# Patient Record
Sex: Female | Born: 2000 | Race: White | Hispanic: No | Smoking: Former smoker
Health system: Southern US, Community
[De-identification: ages and names within clinical notes are randomized; demographics above are authoritative.]

## PROBLEM LIST (undated history)

## (undated) DIAGNOSIS — F329 Major depressive disorder, single episode, unspecified: Secondary | ICD-10-CM

## (undated) DIAGNOSIS — K219 Gastro-esophageal reflux disease without esophagitis: Secondary | ICD-10-CM

## (undated) DIAGNOSIS — G43909 Migraine, unspecified, not intractable, without status migrainosus: Secondary | ICD-10-CM

## (undated) DIAGNOSIS — M792 Neuralgia and neuritis, unspecified: Secondary | ICD-10-CM

## (undated) DIAGNOSIS — F909 Attention-deficit hyperactivity disorder, unspecified type: Secondary | ICD-10-CM

## (undated) DIAGNOSIS — I10 Essential (primary) hypertension: Secondary | ICD-10-CM

## (undated) DIAGNOSIS — M419 Scoliosis, unspecified: Secondary | ICD-10-CM

## (undated) DIAGNOSIS — R110 Nausea: Secondary | ICD-10-CM

## (undated) DIAGNOSIS — F32A Depression, unspecified: Secondary | ICD-10-CM

## (undated) DIAGNOSIS — J302 Other seasonal allergic rhinitis: Secondary | ICD-10-CM

## (undated) HISTORY — DX: Migraine, unspecified, not intractable, without status migrainosus: G43.909

## (undated) HISTORY — DX: Neuralgia and neuritis, unspecified: M79.2

## (undated) HISTORY — DX: Essential (primary) hypertension: I10

## (undated) HISTORY — DX: Gastro-esophageal reflux disease without esophagitis: K21.9

## (undated) HISTORY — PX: BACK SURGERY: SHX140

## (undated) HISTORY — DX: Other seasonal allergic rhinitis: J30.2

## (undated) HISTORY — DX: Depression, unspecified: F32.A

## (undated) HISTORY — DX: Attention-deficit hyperactivity disorder, unspecified type: F90.9

## (undated) HISTORY — DX: Nausea: R11.0

## (undated) HISTORY — PX: MULTIPLE TOOTH EXTRACTIONS: SHX2053

## (undated) HISTORY — DX: Scoliosis, unspecified: M41.9

---
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
## 2019-01-04 ENCOUNTER — Other Ambulatory Visit (INDEPENDENT_AMBULATORY_CARE_PROVIDER_SITE_OTHER): Payer: Self-pay | Admitting: Neurology

## 2019-01-07 ENCOUNTER — Other Ambulatory Visit (INDEPENDENT_AMBULATORY_CARE_PROVIDER_SITE_OTHER): Payer: Self-pay | Admitting: Neurology

## 2019-02-06 ENCOUNTER — Ambulatory Visit (INDEPENDENT_AMBULATORY_CARE_PROVIDER_SITE_OTHER): Payer: Medicaid Other | Admitting: Neurology

## 2019-02-06 ENCOUNTER — Other Ambulatory Visit: Payer: Self-pay

## 2019-02-06 ENCOUNTER — Encounter (INDEPENDENT_AMBULATORY_CARE_PROVIDER_SITE_OTHER): Payer: Self-pay | Admitting: Neurology

## 2019-02-06 DIAGNOSIS — G43009 Migraine without aura, not intractable, without status migrainosus: Secondary | ICD-10-CM | POA: Diagnosis not present

## 2019-02-06 DIAGNOSIS — G44219 Episodic tension-type headache, not intractable: Secondary | ICD-10-CM | POA: Diagnosis not present

## 2019-02-06 DIAGNOSIS — G479 Sleep disorder, unspecified: Secondary | ICD-10-CM

## 2019-02-06 DIAGNOSIS — M792 Neuralgia and neuritis, unspecified: Secondary | ICD-10-CM | POA: Diagnosis not present

## 2019-02-06 MED ORDER — PROPRANOLOL HCL 20 MG PO TABS: 40.0000 mg | ORAL_TABLET | Freq: Two times a day (BID) | ORAL | 4 refills | Status: DC

## 2019-02-06 MED ORDER — CYCLOBENZAPRINE HCL 10 MG PO TABS: ORAL_TABLET | ORAL | 4 refills | Status: DC

## 2019-02-06 MED ORDER — GABAPENTIN 300 MG PO CAPS: ORAL_CAPSULE | ORAL | 5 refills | Status: DC

## 2019-02-06 NOTE — Patient Instructions (Signed)
Since everything has been the same over the past few months, continue the same dose of medications for now If there is any new symptoms, call the office otherwise I would like to see you in 5 months for follow-up visit.

## 2019-02-06 NOTE — Progress Notes (Signed)
This is a Pediatric Specialist E-Visit follow up consult provided via WebEx Luxe Hortencia Pilaricole Barefield consented to an E-Visit consult today.  Location of patient: Leslie PiperLayla is at home Location of provider: Keturah Shaverseza Torien Ramroop, MD is in office Patient was referred by Ronnette JuniperPringle, Joseph, MD   The following participants were involved in this E-Visit:  Lenard SimmerKelly Clark, CMA Dr Tiburcio BashNabizadeh Haide, patient  Chief Complain/ Reason for E-Visit today: headaches Total time on call: 25 minutes Follow up: 5 months  Patient: Rennis ChrisLayla Nicole Orr MRN: [id number] Sex: female DOB: 06/21/2001  Provider: Keturah Shaverseza Earnestine Shipp, MD [facility]  Note type: Routine return visit  Referral Source: Ronnette JuniperJoseph Pringle, MD History [facility]: [facility] Chief Complaint: Headaches  History of Present Illness: Leslie Orr is a 18 y.o. female is on WebEx for follow-up evaluation of headache and neuropathic pain.  Patient has been seen over the past few years with episodes of headache, sleep difficulty and significant neuropathic pain and several other issues, currently on multiple different medications. She was last seen in January 2020 and since then she has been fairly stable without having any new symptoms although she is still having similar complaints as before with headache, sleep difficulty and neuropathic pain but none of her complaints changed or got worse over the past few months. Currently she has been taking fairly high dose of propranolol for headache and gabapentin for neuropathic pain as well as moderate dose of muscle relaxant in addition to medication for anxiety and depressed mood and ADHD. Over the past few months she is still having frequent headaches but they are not worse than before and she is still having some difficulty sleeping through the night.  She has been walking around without any difficulty and her neuropathic pain is stable as well.  Review of Systems: 12 system review  as per HPI, otherwise negative.  History reviewed. No pertinent past medical history. Hospitalizations: No., Head Injury: No., Nervous System Infections: No., Immunizations up to date: Yes.    Surgical History Past Surgical History:  Procedure Laterality Date  . BACK SURGERY  04/22/14 - 04/27/14   Scoliosis  . OTHER SURGICAL HISTORY  Nov. 2001   Pilori Stenosis Surgery    Family History family history includes ADD / ADHD in her brother; Anxiety disorder in her mother; COPD in her paternal grandmother; Depression in her maternal grandmother and mother; Drug abuse in her mother; Emphysema in her paternal grandmother; Heart Problems in her maternal grandfather and mother; Other in her father; Seizures in her father.  Social History Social History   Socioeconomic History  . Marital status: Single    Spouse name: Not on file  . Number of children: Not on file  . Years of education: Not on file  . Highest education level: Not on file  Occupational History  . Not on file  Social Needs  . Financial resource strain: Not on file  . Food insecurity:    Worry: Not on file    Inability: Not on file  . Transportation needs:    Medical: Not on file    Non-medical: Not on file  Tobacco Use  . Smoking status: Passive Smoke Exposure - Never Smoker  . Smokeless tobacco: Never Used  Substance and Sexual Activity  . Alcohol use: No  . Drug use: No  . Sexual activity: Never    Birth control/protection: Abstinence  Lifestyle  . Physical activity:    Days per week: Not on file  Minutes per session: Not on file  . Stress: Not on file  Relationships  . Social connections:    Talks on phone: Not on file    Gets together: Not on file    Attends religious service: Not on file    Active member of club or organization: Not on file    Attends meetings of clubs or organizations: Not on file    Relationship status: Not on file  Other Topics Concern  . Not on file  Social History Narrative    She is home schooled and she has to get her GED.  She lives with her grandparents and two brothers. She enjoys Netflix, reading, and friends visiting.      The medication list was reviewed and reconciled. All changes or newly prescribed medications were explained.  A complete medication list was provided to the patient/caregiver.  Allergies  Allergen Reactions  . Other     Seasonal Allergies  Dust- Takes allergy shots weekly  . Lactase Nausea Only    abd pain    Physical Exam There were no vitals taken for this visit. Her limited neurological exam on WebEx is unremarkable.  She was awake, alert although slightly sleepy, follows instructions appropriately with fluent speech.  She had normal cranial nerve exam.  She did not have any tremor with normal coordination and balance.  Assessment and Plan 1. Migraine without aura and without status migrainosus, not intractable   2. Episodic tension-type headache, not intractable   3. Sleeping difficulty   4. Neuropathic pain   5. Neuritis    This is an 18 year old female with multiple medical and psychological issues as mentioned in HPI, currently on multiple medications with stable condition without any worsening of any of her symptoms over the past few months.  She has no new findings on her neurological examination. Recommend to continue the same dose of medication for now which will help her with her headache, neuropathic pain and muscle spasms and also help with sleeping through the night. She will continue with regular exercise and activity. She will continue with appropriate hydration and adequate sleep through the night. She may take occasional OTC medications for moderate to severe headache. If there is any new symptoms she will call my office otherwise I would like to see her in 5 months for follow-up visit.  She understood and agreed with the plan.  Meds ordered this encounter  Medications  . propranolol (INDERAL) 20 MG tablet     Sig: Take 2 tablets (40 mg total) by mouth 2 (two) times daily.    Dispense:  120 tablet    Refill:  4  . cyclobenzaprine (FLEXERIL) 10 MG tablet    Sig: TAKE 1 TABLET BY MOUTH EVERYDAY AT BEDTIME    Dispense:  30 tablet    Refill:  4  . gabapentin (NEURONTIN) 300 MG capsule    Sig: 2 capsules 3 times daily p.o.    Dispense:  180 capsule    Refill:  5

## 2019-03-04 ENCOUNTER — Other Ambulatory Visit (INDEPENDENT_AMBULATORY_CARE_PROVIDER_SITE_OTHER): Payer: Self-pay | Admitting: Neurology

## 2019-04-30 ENCOUNTER — Ambulatory Visit (LOCAL_COMMUNITY_HEALTH_CENTER): Payer: Medicaid Other

## 2019-04-30 ENCOUNTER — Other Ambulatory Visit: Payer: Self-pay

## 2019-04-30 VITALS — BP 132/85 | Ht 63.0 in | Wt 172.0 lb

## 2019-04-30 DIAGNOSIS — Z30013 Encounter for initial prescription of injectable contraceptive: Secondary | ICD-10-CM

## 2019-04-30 DIAGNOSIS — Z3042 Encounter for surveillance of injectable contraceptive: Secondary | ICD-10-CM

## 2019-04-30 DIAGNOSIS — Z3009 Encounter for other general counseling and advice on contraception: Secondary | ICD-10-CM | POA: Diagnosis not present

## 2019-04-30 MED ORDER — MEDROXYPROGESTERONE ACETATE 150 MG/ML IM SUSP: 150.0000 mg | INTRAMUSCULAR | 0 refills | Status: DC

## 2019-04-30 NOTE — Progress Notes (Signed)
Depo given per 10/3118 C. Lazarus Salines PA order; tolerated well Aileen Fass, RN

## 2019-05-07 ENCOUNTER — Other Ambulatory Visit (INDEPENDENT_AMBULATORY_CARE_PROVIDER_SITE_OTHER): Payer: Self-pay | Admitting: Pediatrics

## 2019-07-18 ENCOUNTER — Ambulatory Visit: Payer: Medicaid Other

## 2019-07-24 ENCOUNTER — Ambulatory Visit: Payer: Medicaid Other

## 2019-08-01 ENCOUNTER — Other Ambulatory Visit: Payer: Self-pay

## 2019-08-01 ENCOUNTER — Ambulatory Visit (LOCAL_COMMUNITY_HEALTH_CENTER): Payer: Medicaid Other

## 2019-08-01 VITALS — BP 124/81 | Ht 63.0 in | Wt 177.0 lb

## 2019-08-01 DIAGNOSIS — Z3009 Encounter for other general counseling and advice on contraception: Secondary | ICD-10-CM | POA: Diagnosis not present

## 2019-08-01 DIAGNOSIS — Z30013 Encounter for initial prescription of injectable contraceptive: Secondary | ICD-10-CM | POA: Diagnosis not present

## 2019-08-01 MED ORDER — MEDROXYPROGESTERONE ACETATE 150 MG/ML IM SUSP
150.0000 mg | Freq: Once | INTRAMUSCULAR | Status: AC
  Administered 2019-08-01: 150 mg via INTRAMUSCULAR

## 2019-08-01 NOTE — Progress Notes (Signed)
Pt here for Depo today. Pt reports no issues with Depo and is satisfied with Depo as BCM. Last Depo 04/30/2019 so she is 13 weeks and 2 days post last Depo. Pt received Depo today per Antoine Primas, PA order from 11/17/2018 visit in [place] records Depo was ordered for 1 year. Pt tolerated Depo well.Ronny Bacon, RN

## 2019-08-07 ENCOUNTER — Other Ambulatory Visit (INDEPENDENT_AMBULATORY_CARE_PROVIDER_SITE_OTHER): Payer: Self-pay | Admitting: Pediatrics

## 2019-10-25 ENCOUNTER — Other Ambulatory Visit (INDEPENDENT_AMBULATORY_CARE_PROVIDER_SITE_OTHER): Payer: Self-pay | Admitting: Neurology

## 2019-10-31 ENCOUNTER — Ambulatory Visit (LOCAL_COMMUNITY_HEALTH_CENTER): Payer: Medicaid Other | Admitting: Family Medicine

## 2019-10-31 ENCOUNTER — Ambulatory Visit (LOCAL_COMMUNITY_HEALTH_CENTER): Payer: Medicaid Other

## 2019-10-31 ENCOUNTER — Other Ambulatory Visit: Payer: Self-pay

## 2019-10-31 ENCOUNTER — Encounter: Payer: Self-pay | Admitting: Family Medicine

## 2019-10-31 VITALS — BP 133/93 | HR 118 | Ht 63.0 in | Wt 178.4 lb

## 2019-10-31 DIAGNOSIS — Z30013 Encounter for initial prescription of injectable contraceptive: Secondary | ICD-10-CM

## 2019-10-31 DIAGNOSIS — Z23 Encounter for immunization: Secondary | ICD-10-CM

## 2019-10-31 DIAGNOSIS — Z3009 Encounter for other general counseling and advice on contraception: Secondary | ICD-10-CM

## 2019-10-31 DIAGNOSIS — Z01419 Encounter for gynecological examination (general) (routine) without abnormal findings: Secondary | ICD-10-CM

## 2019-10-31 DIAGNOSIS — Z113 Encounter for screening for infections with a predominantly sexual mode of transmission: Secondary | ICD-10-CM

## 2019-10-31 LAB — WET PREP FOR TRICH, YEAST, CLUE
Clue Cell Exam: NEGATIVE
Trichomonas Exam: NEGATIVE
Yeast Exam: NEGATIVE

## 2019-10-31 MED ORDER — MULTI-VITAMIN/MINERALS PO TABS: 1.0000 | ORAL_TABLET | Freq: Every day | ORAL | 0 refills | Status: AC

## 2019-10-31 MED ORDER — MEDROXYPROGESTERONE ACETATE 150 MG/ML IM SUSP
150.0000 mg | INTRAMUSCULAR | Status: AC
  Administered 2019-10-31 – 2020-07-07 (×4): 150 mg via INTRAMUSCULAR

## 2019-10-31 NOTE — Progress Notes (Signed)
Last Inderal taken 2 days ago. Per client, has appt with [facility] tomorrow and states Inderal will be reordered at that time. Jossie Ng, RN

## 2019-10-31 NOTE — Progress Notes (Signed)
Family Planning Visit- Initial Visit  Subjective:  Leslie Orr is a 19 y.o. being seen today for an initial well woman visit and to discuss family planning options.  She is currently using Depo-Provera injections for pregnancy prevention. Patient reports she does not want a pregnancy in the next year.  Patient has the following medical conditions has Migraine without aura and without status migrainosus, not intractable; Tension headache; Idiopathic scoliosis; Episodic tension-type headache, not intractable; Neuropathic pain; Bruxism; Neuritis; Sleeping difficulty; Depression; Gastroesophageal reflux disease; and ADHD on their problem list.  Chief Complaint  Patient presents with  . Annual Physical and Depo    Patient reports she is here for depo and physical. She is 13 wks post last depo injection. Has been on depo x2 yrs.   HTN: ran out of pills a few days ago, usually on BB, therefore states pulse and BP are elevated today. States she is feeling well and has upcoming PCP appt for more.    Body mass index is 31.6 kg/m. - Patient is eligible for diabetes screening based on BMI and age >12?  no HA1C ordered? not applicable 1Patient reports 1 of partners in last year. Desires STI screening?  Yes  Does the patient have a current or past history of drug use? No   No components found for: HCV]   Health Maintenance Due  Topic Date Due  . CHLAMYDIA SCREENING  07/26/2015  . TETANUS/TDAP  07/26/2019    ROS + for:  Nausea: attributes to reflux HA: seeing PCP for this  The following portions of the patient's history were reviewed and updated as appropriate: allergies, current medications, past family history, past medical history, past social history, past surgical history and problem list. Problem list updated.   See flowsheet for other program required questions.  Objective:   Vitals:   10/31/19 1521  BP: (!) 133/93  Pulse: (!) 118  Weight: 178 lb 6.4 oz (80.9 kg)   Height: 5\' 3"  (1.6 m)   Repeat pulse: 102  Physical Exam Vitals and nursing note reviewed.  Constitutional:      Appearance: Normal appearance.  HENT:     Head: Normocephalic and atraumatic.     Mouth/Throat:     Mouth: Mucous membranes are moist.     Pharynx: Oropharynx is clear. No oropharyngeal exudate or posterior oropharyngeal erythema.  Eyes:     Conjunctiva/sclera: Conjunctivae normal.  Neck:     Thyroid: No thyroid mass, thyromegaly or thyroid tenderness.  Cardiovascular:     Rate and Rhythm: Accelerated rate, regular rhythm. No murmurs.    Pulses: Normal pulses.     Heart sounds: Normal heart sounds.  Pulmonary:     Effort: Pulmonary effort is normal.     Breath sounds: Normal breath sounds.  Chest:     Breasts: Deferred until age 18. Abdominal:     General: Abdomen is flat.     Palpations: There is no mass.     Tenderness: There is no abdominal tenderness. There is no rebound.  Genitourinary:     General: Normal vulva.     Exam position: Lithotomy position.     Pubic Area: No rash or pubic lice.      Labia:        Right: No rash or lesion.        Left: No rash or lesion.      Vagina: Normal. No vaginal discharge, erythema, bleeding or lesions.     Cervix: No cervical motion  tenderness, discharge, friability, lesion or erythema.     Uterus: Normal.      Adnexa: Right adnexa normal and left adnexa normal.     Rectum: Normal.  Lymphadenopathy:     Head:     Right side of head: No preauricular or posterior auricular adenopathy.     Left side of head: No preauricular or posterior auricular adenopathy.     Cervical: No cervical adenopathy.     Upper Body:     Right upper body: No supraclavicular or axillary adenopathy.     Left upper body: No supraclavicular or axillary adenopathy.     Lower Body: No right inguinal adenopathy. No left inguinal adenopathy.  Skin:    General: Skin is warm and dry.     Findings: No rash.  Neurological:     Mental Status: She  is alert and oriented to person, place, and time.       Assessment and Plan:  Leslie Orr is a 19 y.o. female presenting to the [facility] Department for an initial well woman exam/family planning visit  Contraception counseling: Reviewed all forms of birth control options in the tiered based approach. available including abstinence; over the counter/barrier methods; hormonal contraceptive medication including pill, patch, ring, injection,contraceptive implant; hormonal and nonhormonal IUDs; permanent sterilization options including vasectomy and the various tubal sterilization modalities. Risks, benefits, and typical effectiveness rates were reviewed.  Questions were answered.  Written information was also given to the patient to review.  Patient desires depo, this was prescribed for patient. She will follow up in  3 months for surveillance.  She was told to call with any further questions, or with any concerns about this method of contraception.  Emphasized use of condoms 100% of the time for STI prevention.  ECP n/a, continuous depo use.    1. Well woman exam -Physical today.  -Rx depo x1 yr. Counseling as above. Risks of Depo use >2 yrs discussed with pt. Alternative BCM discussed, pt would like to continue Depo. Suggested calcium (1200 mg daily) and vitamin D (800 IU daily) supplements, weight bearing exercise and avoiding cigarette smoking and excessive alcohol consumption to promote bone health.  -Follow up in 3 months for repeat Depo Provera injection.  -Pt states she has soon f/u with PCP for chronic issues, including HTN for which she needs more medication - medroxyPROGESTERone (DEPO-PROVERA) injection 150 mg  2. Screening examination for venereal disease -Screenings today as below. Treat wet prep per standing order. -Patient does not meet criteria for HepB, HepC Screening.  -Counseled on warning s/sx and when to seek care. Recommended condom use with all sex and  discussed importance of condom use for STI prevention. - HIV [place] [facility] - Syphilis Serology, [facility] - Chlamydia/Gonorrhea [facility] - WET PREP FOR TRICH, YEAST, CLUE   Return in about 11 weeks (around 01/16/2020) for Depo.  Future Appointments  Date Time Provider Department Center  11/01/2019  3:30 PM Keturah Shavers, MD [facility] None    Ann Held, PA-C

## 2019-11-01 ENCOUNTER — Encounter (INDEPENDENT_AMBULATORY_CARE_PROVIDER_SITE_OTHER): Payer: Self-pay | Admitting: Neurology

## 2019-11-01 ENCOUNTER — Ambulatory Visit (INDEPENDENT_AMBULATORY_CARE_PROVIDER_SITE_OTHER): Payer: Medicaid Other | Admitting: Neurology

## 2019-11-01 ENCOUNTER — Encounter: Payer: Self-pay | Admitting: Family Medicine

## 2019-11-01 VITALS — Ht 64.0 in | Wt 178.0 lb

## 2019-11-01 DIAGNOSIS — G479 Sleep disorder, unspecified: Secondary | ICD-10-CM | POA: Diagnosis not present

## 2019-11-01 DIAGNOSIS — G44219 Episodic tension-type headache, not intractable: Secondary | ICD-10-CM

## 2019-11-01 DIAGNOSIS — M792 Neuralgia and neuritis, unspecified: Secondary | ICD-10-CM

## 2019-11-01 DIAGNOSIS — G43009 Migraine without aura, not intractable, without status migrainosus: Secondary | ICD-10-CM | POA: Diagnosis not present

## 2019-11-01 MED ORDER — GABAPENTIN 300 MG PO CAPS: ORAL_CAPSULE | ORAL | 6 refills | Status: DC

## 2019-11-01 MED ORDER — PROMETHAZINE HCL 25 MG PO TABS: ORAL_TABLET | ORAL | 2 refills | Status: DC

## 2019-11-01 MED ORDER — CYCLOBENZAPRINE HCL 10 MG PO TABS: ORAL_TABLET | ORAL | 6 refills | Status: DC

## 2019-11-01 MED ORDER — PROPRANOLOL HCL 20 MG PO TABS: 40.0000 mg | ORAL_TABLET | Freq: Two times a day (BID) | ORAL | 6 refills | Status: DC

## 2019-11-01 NOTE — Patient Instructions (Addendum)
Continue with the same dose of propranolol at 40 mg twice daily Continue with lower dose of Neurontin at 600 mg twice daily May take Flexeril and promethazine if needed for muscle spasms or for nausea Continue drinking more water with adequate sleep and limiting screen time Call my office if there is any more frequent headache or body pain Return in 7 months for follow-up visit

## 2019-11-01 NOTE — Progress Notes (Signed)
This is a Pediatric Specialist E-Visit follow up consult provided via WebEx [name] consented to an E-Visit consult today.  Location of patient: [place] is at Home(location) Location of provider: Keturah Shavers, MD is at Office (location) Patient was referred by Ronnette Juniper, MD   The following participants were involved in this E-Visit: Tresa Endo, [place]              Keturah Shavers, MD Chief Complain/ Reason for E-Visit today: Headache Total time on call: 25 minutes Follow up: 7 months   Patient: Leslie Orr MRN: [id number] Sex: female DOB: Sep 18, 2001  Provider: Keturah Shavers, MD Location of Care: [facility]  Note type: Routine return visit History from: patient and [facility] chart Chief Complaint: Headaches are the same, rib pain the same  History of Present Illness: Leslie Orr is a 19 y.o. female is here on WebEx for follow-up management of headache and muscle pain and neuropathy.  She has been having multiple medical issues for which she has been seen for the past several years including frequent headache, neuropathic pain, musculoskeletal pain, sleep difficulty, anxiety and depression.  She has been seen and followed by psychiatry and has been on multiple medications that prescribed by neurology and psychiatry. She was last seen in April and since then she has been doing fairly well and has been having less frequent headaches and has not been taking OTC medications frequently.  She is also having less neuropathy and neuropathic pain and has been taking lower dose of Neurontin.  She has been on fairly high dose of propranolol but she has been tolerating the medication well and it has been working for her headaches significantly. She has been sleeping better through the night as well and has not had any new complaints at this time although she is still having musculoskeletal and neuropathic pain off and on but they are not worse then before.  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  Review of Systems: 12 system review as per HPI, otherwise negative.  Past Medical History:  Diagnosis Date  . Acid reflux   . ADHD   . Depression   . Hypertension   . Migraines   . Nausea    due to reflux  . Neuralgia    intercostal nerve pain at ribs  . Seasonal allergies    Hospitalizations: No., Head Injury: No., Nervous System Infections: No., Immunizations up to date: Yes.     Surgical History Past Surgical History:  Procedure Laterality Date  . BACK SURGERY  04/22/14 - 04/27/14   Scoliosis  . OTHER SURGICAL HISTORY  Nov. 2001   [facility] Stenosis Surgery    Family History family history includes ADD / ADHD in her brother; Anxiety disorder in her mother; Arthritis in her maternal grandmother; COPD in her paternal grandmother; Depression in her maternal grandmother and mother; Diabetes in her paternal grandmother; Drug abuse in her mother; Emphysema in her paternal grandmother; Heart Problems in her maternal grandfather and mother; Hyperlipidemia in her maternal grandmother; Migraines in her father and maternal grandmother; Other in her father; Seizures in her father.   Social History Social History   Socioeconomic History  . Marital status: Single    Spouse name: Not on file  . Number of children: Not on file  . Years of education: Not on file  . Highest education level: Not  on file  Occupational History  . Not on file  Tobacco Use  . Smoking status: Passive Smoke Exposure - Never Smoker  . Smokeless tobacco: Never Used  Substance and Sexual Activity  . Alcohol use: Never  . Drug use: Never  . Sexual activity: Yes    Birth control/protection: Abstinence, Condom, Injection  Other Topics Concern  . Not on file  Social History Narrative   She is home schooled and she has to get her GED.  She lives with her grandparents and two brothers. She  enjoys Netflix, reading, and friends visiting.    Social Determinants of Health   Financial Resource Strain:   . Difficulty of Paying Living Expenses: Not on file  Food Insecurity:   . Worried About Programme researcher, broadcasting/film/video in the Last Year: Not on file  . Ran Out of Food in the Last Year: Not on file  Transportation Needs:   . Lack of Transportation (Medical): Not on file  . Lack of Transportation (Non-Medical): Not on file  Physical Activity:   . Days of Exercise per Week: Not on file  . Minutes of Exercise per Session: Not on file  Stress:   . Feeling of Stress : Not on file  Social Connections:   . Frequency of Communication with Friends and Family: Not on file  . Frequency of Social Gatherings with Friends and Family: Not on file  . Attends Religious Services: Not on file  . Active Member of Clubs or Organizations: Not on file  . Attends Banker Meetings: Not on file  . Marital Status: Not on file     The medication list was reviewed and reconciled. All changes or newly prescribed medications were explained.  A complete medication list was provided to the patient/caregiver.  Allergies  Allergen Reactions  . Other     Seasonal Allergies - itchy eyes, runny nose Dust- Takes allergy shots weekly (10/31/2019 - no longer taking)  . Lactase Nausea Only    abd pain  . Penicillins Rash    Physical Exam Ht 5\' 4"  (1.626 m) Comment: patient reported  Wt 178 lb (80.7 kg) Comment: patient reported  BMI 30.55 kg/m  Her limited neurological exam on WebEx is normal.  She was awake, alert, follows instructions appropriately with normal comprehension and fluent speech.  She had normal and symmetric face with no nystagmus and with normal cranial nerve exam.  She had normal walk with no coordination or balance issues.  She did not have any tremor and no dysmetria on finger-to-nose testing.  She had normal range of motion.  Assessment and Plan 1. Migraine without aura and  without status migrainosus, not intractable   2. Episodic tension-type headache, not intractable   3. Neuropathic pain   4. Sleeping difficulty   5. Neuritis    This is an 19 year old female with multiple medical issues as mentioned in HPI who has been seen over the past few years with fairly good improvement over the past few months, needed less medication including Neurontin although she takes many other medications as she was in the past but no new medications over the past year.  She has a fairly normal limited neurological exam.  She also look happier and not appear in pain as she was in the past. Recommend to continue the same dose of medication as she is taking at this time which include fairly high dose of propranolol at 40 mg twice daily and lower dose of gabapentin that  she is taking now which is 600 mg twice daily. She is also taking occasional Flexeril for muscle spasms and promethazine for nausea with her headache.  She may take occasional Tylenol or ibuprofen for moderate to severe headache.  Occasionally she may use Imitrex. Discussed with patient the importance of drinking more water and adequate sleep and limited screen time to prevent from more headaches. She will continue follow-up with psychiatry to manage her other medications. I would like to see her in 7 months for follow-up visit or sooner if she develops more frequent symptoms.  She understood and agreed with the plan.   Meds ordered this encounter  Medications  . propranolol (INDERAL) 20 MG tablet    Sig: Take 2 tablets (40 mg total) by mouth 2 (two) times daily.    Dispense:  120 tablet    Refill:  6  . gabapentin (NEURONTIN) 300 MG capsule    Sig: 2 capsules 2 times daily p.o.    Dispense:  120 capsule    Refill:  6  . cyclobenzaprine (FLEXERIL) 10 MG tablet    Sig: TAKE 1 TABLET BY MOUTH EVERYDAY AT BEDTIME    Dispense:  30 tablet    Refill:  6  . promethazine (PHENERGAN) 25 MG tablet    Sig: Take 1 tablet as  needed for nausea or vomiting    Dispense:  30 tablet    Refill:  2

## 2020-01-31 ENCOUNTER — Ambulatory Visit (LOCAL_COMMUNITY_HEALTH_CENTER): Payer: Medicaid Other

## 2020-01-31 ENCOUNTER — Other Ambulatory Visit: Payer: Self-pay

## 2020-01-31 VITALS — BP 100/68 | Ht 63.0 in | Wt 177.0 lb

## 2020-01-31 DIAGNOSIS — Z30013 Encounter for initial prescription of injectable contraceptive: Secondary | ICD-10-CM

## 2020-01-31 DIAGNOSIS — Z3009 Encounter for other general counseling and advice on contraception: Secondary | ICD-10-CM

## 2020-01-31 NOTE — Progress Notes (Signed)
Client no longer desires to take MVI. Depo administered without difficulty per 10/31/2019 written order of J. Staples PA-C and client tolerated without complaint. Jossie Ng, RN

## 2020-03-03 ENCOUNTER — Encounter: Payer: Self-pay | Admitting: Emergency Medicine

## 2020-03-03 ENCOUNTER — Other Ambulatory Visit: Payer: Self-pay

## 2020-03-03 ENCOUNTER — Emergency Department
Admission: EM | Admit: 2020-03-03 | Discharge: 2020-03-03 | Disposition: A | Discharge status: Home / Self Care | Payer: Medicaid Other | Attending: Emergency Medicine | Admitting: Emergency Medicine

## 2020-03-03 DIAGNOSIS — Z7722 Contact with and (suspected) exposure to environmental tobacco smoke (acute) (chronic): Secondary | ICD-10-CM | POA: Insufficient documentation

## 2020-03-03 DIAGNOSIS — Z79899 Other long term (current) drug therapy: Secondary | ICD-10-CM | POA: Insufficient documentation

## 2020-03-03 DIAGNOSIS — R112 Nausea with vomiting, unspecified: Secondary | ICD-10-CM | POA: Insufficient documentation

## 2020-03-03 DIAGNOSIS — R197 Diarrhea, unspecified: Secondary | ICD-10-CM | POA: Diagnosis not present

## 2020-03-03 DIAGNOSIS — I10 Essential (primary) hypertension: Secondary | ICD-10-CM | POA: Insufficient documentation

## 2020-03-03 LAB — URINALYSIS, COMPLETE (UACMP) WITH MICROSCOPIC
Bilirubin Urine: NEGATIVE
Glucose, UA: NEGATIVE mg/dL
Hgb urine dipstick: NEGATIVE
Ketones, ur: NEGATIVE mg/dL
Nitrite: NEGATIVE
Protein, ur: NEGATIVE mg/dL
Specific Gravity, Urine: 1.01 (ref 1.005–1.030)
pH: 6 (ref 5.0–8.0)

## 2020-03-03 LAB — COMPREHENSIVE METABOLIC PANEL
ALT: 16 U/L (ref 0–44)
AST: 18 U/L (ref 15–41)
Albumin: 4.5 g/dL (ref 3.5–5.0)
Alkaline Phosphatase: 82 U/L (ref 38–126)
Anion gap: 7 (ref 5–15)
BUN: 7 mg/dL (ref 6–20)
CO2: 24 mmol/L (ref 22–32)
Calcium: 9.6 mg/dL (ref 8.9–10.3)
Chloride: 106 mmol/L (ref 98–111)
Creatinine, Ser: 0.57 mg/dL (ref 0.44–1.00)
GFR calc Af Amer: >60 mL/min (ref >60)
GFR calc non Af Amer: >60 mL/min (ref >60)
Glucose, Bld: 96 mg/dL (ref 70–99)
Potassium: 4.1 mmol/L (ref 3.5–5.1)
Sodium: 137 mmol/L (ref 135–145)
Total Bilirubin: 0.7 mg/dL (ref 0.3–1.2)
Total Protein: 7.8 g/dL (ref 6.5–8.1)

## 2020-03-03 LAB — CBC
HCT: 39.2 % (ref 36.0–46.0)
Hemoglobin: 13.1 g/dL (ref 12.0–15.0)
MCH: 29.5 pg (ref 26.0–34.0)
MCHC: 33.4 g/dL (ref 30.0–36.0)
MCV: 88.3 fL (ref 80.0–100.0)
Platelets: 329 10*3/uL (ref 150–400)
RBC: 4.44 MIL/uL (ref 3.87–5.11)
RDW: 12.8 % (ref 11.5–15.5)
WBC: 4.4 10*3/uL (ref 4.0–10.5)
nRBC: 0.5 % — ABNORMAL HIGH (ref 0.0–0.2)

## 2020-03-03 LAB — LIPASE, BLOOD: Lipase: 20 U/L (ref 11–51)

## 2020-03-03 LAB — POCT PREGNANCY, URINE: Preg Test, Ur: NEGATIVE

## 2020-03-03 MED ORDER — LOPERAMIDE HCL 2 MG PO TABS
2.0000 mg | ORAL_TABLET | ORAL | 0 refills | Status: AC
Start: 2020-03-03 — End: ?

## 2020-03-03 MED ORDER — SODIUM CHLORIDE 0.9% FLUSH: 3.0000 mL | Freq: Once | INTRAVENOUS | Status: DC

## 2020-03-03 MED ORDER — PANTOPRAZOLE SODIUM 20 MG PO TBEC
20.0000 mg | DELAYED_RELEASE_TABLET | Freq: Every day | ORAL | 1 refills | Status: DC
Start: 2020-03-03 — End: 2022-01-12

## 2020-03-03 NOTE — ED Triage Notes (Addendum)
C/O diarrhea x 2 months.  Prescribed bentyl, without results.  Also c/o migraine and nausea x 1 day.  AAOx3.  Skin warm and dry. NAD

## 2020-03-03 NOTE — ED Provider Notes (Signed)
[facility] Emergency Department Provider Note  Time seen: 3:30 PM  I have reviewed the triage vital signs and the nursing notes.   HISTORY  Chief Complaint Diarrhea and Headache   HPI Leslie Orr is a 19 y.o. female with a past medical history of gastric reflux, hypertension, possible IBS, presents to the emergency department for nausea vomiting diarrhea.  According to the patient for the past 2 months she has been experiencing intermittent nausea vomiting and diarrhea.  States the diarrhea is on a near daily basis the vomiting only occurs once or twice per week.  Patient states a history of H. pylori in the past, has a GI doctor at [facility] that she follows up with but has not been able to see during this episode.  Has a follow-up appointment scheduled.  Patient denies any fever at any point.  Has not taken any antidiarrheal medication.  Patient states her doctor diagnosed her with likely IBS, tried Bentyl but did not notice any improvement.  Patient went to the walk-in clinic this morning was experiencing some mild lower abdominal pain/cramping so sent to the emergency department.  Here denies any discomfort at this time.  Past Medical History:  Diagnosis Date  . Acid reflux   . ADHD   . Depression   . Hypertension   . Migraines   . Nausea    due to reflux  . Neuralgia    intercostal nerve pain at ribs  . Seasonal allergies     Patient Active Problem List   Diagnosis Date Noted  . Sleeping difficulty 01/28/2017  . Bruxism 02/11/2016  . Neuritis 02/11/2016  . Neuropathic pain 02/27/2015  . Depression 02/14/2015  . Gastroesophageal reflux disease 02/14/2015  . Episodic tension-type headache, not intractable 07/19/2014  . Migraine without aura and without status migrainosus, not intractable 02/21/2014  . Tension headache 02/21/2014  . Idiopathic scoliosis 02/21/2014  . ADHD 01/11/2014    Past Surgical History:  Procedure Laterality Date  . BACK  SURGERY  04/22/14 - 04/27/14   Scoliosis  . OTHER SURGICAL HISTORY  Nov. 2001   Pilori Stenosis Surgery    Prior to Admission medications   Medication Sig Start Date End Date Taking? Authorizing Provider  azelastine (ASTELIN) 0.1 % nasal spray ONE SPRAY EACH NOSTRIL EVERY 12 HOUR 09/18/15   [provider]  baclofen (LIORESAL) 10 MG tablet Taking 10mg  three times per day 06/30/16   [provider]  buPROPion (WELLBUTRIN XL) 150 MG 24 hr tablet Take 150 mg by mouth.    [provider]  cetirizine (ZYRTEC) 10 MG tablet Take 10 mg by mouth daily.    [provider]  cyclobenzaprine (FLEXERIL) 10 MG tablet TAKE 1 TABLET BY MOUTH EVERYDAY AT BEDTIME 11/01/19   11/03/19, MD  DULoxetine (CYMBALTA) 30 MG capsule Take 90 mg by mouth daily.    [provider]  DULoxetine (CYMBALTA) 60 MG capsule 1 CAPSULE BY MOUTH DAILY WITH 30MG  DULOXETINE 03/20/18   [provider]  EPIPEN 2-PAK 0.3 MG/0.3ML SOAJ injection See admin instructions. 06/17/15   [provider]  esomeprazole (NEXIUM) 40 MG capsule Take 40 mg by mouth 2 (two) times daily.    [provider]  fluticasone (FLONASE) 50 MCG/ACT nasal spray SPRAY 1 SPRAY INTO EACH NOSTRIL EVERY DAY 04/10/18   [provider]  gabapentin (NEURONTIN) 300 MG capsule 2 capsules 2 times daily p.o. 11/01/19   04/12/18, MD  magnesium gluconate (MAGONATE) 500 MG  tablet Take 500 mg by mouth 2 (two) times daily.    [provider]  methylphenidate 36 MG PO CR tablet Take 72 mg by mouth daily.    [provider]  montelukast (SINGULAIR) 10 MG tablet Take 10 mg by mouth.    [provider]  Multiple Vitamins-Minerals (MULTIVITAMIN WITH MINERALS) tablet Take 1 tablet by mouth daily. Patient not taking: Reported on 01/31/2020 10/31/19   Caren Macadam, MD  ondansetron (ZOFRAN-ODT) 4 MG disintegrating tablet TAKE 1 TABLET (4 MG TOTAL) BY MOUTH EVERY EIGHT (8)  HOURS AS NEEDED FOR NAUSEA. 04/10/18   [provider]  predniSONE (DELTASONE) 20 MG tablet 60 mg qam for 2 days, 40 mg qam for 2 days, 20 mgqam for 2 days PO Patient not taking: Reported on 04/12/2018 02/22/18   Teressa Lower, MD  promethazine (PHENERGAN) 25 MG tablet Take 1 tablet as needed for nausea or vomiting 11/01/19   Teressa Lower, MD  propranolol (INDERAL) 20 MG tablet Take 2 tablets (40 mg total) by mouth 2 (two) times daily. 11/01/19   Teressa Lower, MD  ranitidine (ZANTAC) 150 MG tablet TAKE 1 TABLET (150 MG TOTAL) BY MOUTH BID 05/13/15   [provider]  riboflavin (VITAMIN B-2) 100 MG TABS tablet Take 100 mg by mouth daily.    [provider]  SUMAtriptan (IMITREX) 50 MG tablet TAKE 1 TABLET WITH 600 MG OF IBUPROFEN FOR MODERATE TO SEVERE HEADACHE, MAXIMUM 2 TIMES A WEEK 08/08/19   Teressa Lower, MD    Allergies  Allergen Reactions  . Other     Seasonal Allergies - itchy eyes, runny nose Dust- Takes allergy shots weekly (10/31/2019 - no longer taking)  . Lactase Nausea Only    abd pain  . Penicillins Rash    Family History  Problem Relation Age of Onset  . Drug abuse Mother   . Heart Problems Mother   . Anxiety disorder Mother   . Depression Mother   . Other Father        Brain Tumor  . Seizures Father   . Migraines Father   . ADD / ADHD Brother        1 maternal 1/2 brother has ADHD  . Depression Maternal Grandmother   . Migraines Maternal Grandmother   . Arthritis Maternal Grandmother   . Hyperlipidemia Maternal Grandmother   . Heart Problems Maternal Grandfather   . Emphysema Paternal Grandmother   . COPD Paternal Grandmother   . Diabetes Paternal Grandmother     Social History Social History   Tobacco Use  . Smoking status: Passive Smoke Exposure - Never Smoker  . Smokeless tobacco: Never Used  Substance Use Topics  . Alcohol use: Never  . Drug use: Never    Review of Systems Constitutional: Negative for  fever. Cardiovascular: Negative for chest pain. Respiratory: Negative for shortness of breath. Gastrointestinal: Intermittent abdominal pain/cramping.  Intermittent nausea vomiting diarrhea times months. Genitourinary: Negative for urinary compaints Musculoskeletal: Negative for musculoskeletal complaints Neurological: Negative for headache All other ROS negative  ____________________________________________   PHYSICAL EXAM:  VITAL SIGNS: ED Triage Vitals  Enc Vitals Group     BP 03/03/20 1136 137/79     Pulse Rate 03/03/20 1136 (!) 104     Resp 03/03/20 1136 16     Temp 03/03/20 1136 98.7 F (37.1 C)     Temp Source 03/03/20 1136 Oral     SpO2 03/03/20 1136 98 %     Weight 03/03/20 1133  177 lb 0.5 oz (80.3 kg)     Height 03/03/20 1133 5\' 3"  (1.6 m)     Head Circumference --      Peak Flow --      Pain Score 03/03/20 1133 7     Pain Loc --      Pain Edu? --      Excl. in GC? --     Constitutional: Alert and oriented. Well appearing and in no distress. Eyes: Normal exam ENT      Head: Normocephalic and atraumatic.      Mouth/Throat: Mucous membranes are moist. Cardiovascular: Normal rate, regular rhythm.  Respiratory: Normal respiratory effort without tachypnea nor retractions. Breath sounds are clear  Gastrointestinal: Soft and nontender. No distention.   Musculoskeletal: Nontender with normal range of motion in all extremities Neurologic:  Normal speech and language. No gross focal neurologic deficits  Skin:  Skin is warm, dry and intact.  Psychiatric: Mood and affect are normal. Speech and behavior are normal.   ____________________________________________   INITIAL IMPRESSION / ASSESSMENT AND PLAN / ED COURSE  Pertinent labs & imaging results that were available during my care of the patient were reviewed by me and considered in my medical decision making (see chart for details).   Patient presents to the emergency department for intermittent nausea vomiting  diarrhea over the past 2 months.  Overall the patient appears well, no distress.  Nontender abdomen, no CVA tenderness.  Reassuring work-up including lab work.  Reassuring vitals and physical exam.  We will discharge the patient with a trial of loperamide every other day as well as a daily PPI.  Patient will follow up with her GI doctor at [facility].  Russell Cheris Tweten was evaluated in Emergency Department on 03/03/2020 for the symptoms described in the history of present illness. She was evaluated in the context of the global COVID-19 pandemic, which necessitated consideration that the patient might be at risk for infection with the SARS-CoV-2 virus that causes COVID-19. Institutional protocols and algorithms that pertain to the evaluation of patients at risk for COVID-19 are in a state of rapid change based on information released by regulatory bodies including the CDC and federal and state organizations. These policies and algorithms were followed during the patient's care in the ED.  ____________________________________________   FINAL CLINICAL IMPRESSION(S) / ED DIAGNOSES  Nausea vomiting diarrhea   03/05/2020, MD 03/03/20 [phone]

## 2020-03-18 ENCOUNTER — Encounter (INDEPENDENT_AMBULATORY_CARE_PROVIDER_SITE_OTHER): Payer: Self-pay | Admitting: Neurology

## 2020-03-18 ENCOUNTER — Ambulatory Visit (INDEPENDENT_AMBULATORY_CARE_PROVIDER_SITE_OTHER): Payer: Medicaid Other | Admitting: Neurology

## 2020-03-18 ENCOUNTER — Other Ambulatory Visit: Payer: Self-pay

## 2020-03-18 VITALS — BP 122/78 | HR 76 | Ht 64.0 in | Wt 172.8 lb

## 2020-03-18 DIAGNOSIS — G43009 Migraine without aura, not intractable, without status migrainosus: Secondary | ICD-10-CM

## 2020-03-18 DIAGNOSIS — G44219 Episodic tension-type headache, not intractable: Secondary | ICD-10-CM

## 2020-03-18 DIAGNOSIS — G479 Sleep disorder, unspecified: Secondary | ICD-10-CM

## 2020-03-18 DIAGNOSIS — M792 Neuralgia and neuritis, unspecified: Secondary | ICD-10-CM | POA: Diagnosis not present

## 2020-03-18 DIAGNOSIS — F458 Other somatoform disorders: Secondary | ICD-10-CM

## 2020-03-18 MED ORDER — GABAPENTIN 300 MG PO CAPS: ORAL_CAPSULE | ORAL | 6 refills | Status: DC

## 2020-03-18 MED ORDER — PROMETHAZINE HCL 25 MG PO TABS: ORAL_TABLET | ORAL | 2 refills | Status: DC

## 2020-03-18 MED ORDER — PROPRANOLOL HCL 20 MG PO TABS: 40.0000 mg | ORAL_TABLET | Freq: Two times a day (BID) | ORAL | 6 refills | Status: DC

## 2020-03-18 MED ORDER — CLONIDINE HCL 0.2 MG PO TABS: 0.2000 mg | ORAL_TABLET | Freq: Every day | ORAL | 4 refills | Status: DC

## 2020-03-18 MED ORDER — SUMATRIPTAN SUCCINATE 50 MG PO TABS: ORAL_TABLET | ORAL | 1 refills | Status: AC

## 2020-03-18 NOTE — Patient Instructions (Signed)
Continue with more hydration, adequate sleep and limited screen time Start taking clonidine 1 hour before sleep Continue with regular exercise Continue with regular therapy for anxiety issues Follow-up with your GI doctor Return in 5 months for follow-up visit

## 2020-03-18 NOTE — Progress Notes (Signed)
Patient: Leslie Orr MRN: [id number] Sex: female DOB: 2000/11/27  Provider: Teressa Lower, MD Location of Care: [facility] Child Neurology  Note type: Routine return visit  Referral Source: Ander Slade, MD History from: patient and [facility] chart Chief Complaint: Headaches  History of Present Illness: Leslie Orr is a 19 y.o. female is here for follow-up management of headache, muscle pain, sleep difficulty and neuropathy but with recent increased headache intensity and frequency. Patient has been seen over the past few years with multiple medical issues for which she has been followed by several services including GI service, psychiatry and neurology. She has been on multiple medications and has been fairly stable over the past couple of years but she has been having headaches off and on and recently she has had more frequent headaches.  She is also having episodes of intermittent diarrhea over the past few months and she is going to see her GI doctor again. Currently she has been taking fairly high dose of propranolol as a preventive medication for her headache and also she has been taking occasional promethazine for nausea and she is on moderate dose of Neurontin to help with neuropathy and musculoskeletal pain. Over the past few months she has been having headaches on average 2 or 3 headaches each week for which she may need to take OTC medications for some of them. She is still having difficulty sleeping through the night and usually sleeps late after midnight.  Recently she had plantar fasciitis and was seen by podiatrist.  Review of Systems: Review of system as per HPI, otherwise negative.  Past Medical History:  Diagnosis Date  . Acid reflux   . ADHD   . Depression   . Hypertension   . Migraines   . Nausea    due to reflux  . Neuralgia    intercostal nerve pain at ribs  . Seasonal allergies    Hospitalizations: No., Head Injury: No., Nervous System  Infections: No., Immunizations up to date: Yes.    Surgical History Past Surgical History:  Procedure Laterality Date  . BACK SURGERY  04/22/14 - 04/27/14   Scoliosis  . OTHER SURGICAL HISTORY  Nov. 2001   Pilori Stenosis Surgery    Family History family history includes ADD / ADHD in her brother; Anxiety disorder in her mother; Arthritis in her maternal grandmother; COPD in her paternal grandmother; Depression in her maternal grandmother and mother; Diabetes in her paternal grandmother; Drug abuse in her mother; Emphysema in her paternal grandmother; Heart Problems in her maternal grandfather and mother; Hyperlipidemia in her maternal grandmother; Migraines in her father and maternal grandmother; Other in her father; Seizures in her father.   Social History Social History   Socioeconomic History  . Marital status: Single    Spouse name: Not on file  . Number of children: Not on file  . Years of education: Not on file  . Highest education level: Not on file  Occupational History  . Not on file  Tobacco Use  . Smoking status: Passive Smoke Exposure - Never Smoker  . Smokeless tobacco: Never Used  Substance and Sexual Activity  . Alcohol use: Never  . Drug use: Never  . Sexual activity: Yes    Birth control/protection: Abstinence, Condom, Injection  Other Topics Concern  . Not on file  Social History Narrative   She is working at [employer] She lives with her grandparents, two brothers, and two friends. She enjoys Netflix, reading, and friends visiting.  Social Determinants of Health   Financial Resource Strain:   . Difficulty of Paying Living Expenses:   Food Insecurity:   . Worried About Programme researcher, broadcasting/film/video in the Last Year:   . Barista in the Last Year:   Transportation Needs:   . Freight forwarder (Medical):   Marland Kitchen Lack of Transportation (Non-Medical):   Physical Activity:   . Days of Exercise per Week:   . Minutes of Exercise per Session:    Stress:   . Feeling of Stress :   Social Connections:   . Frequency of Communication with Friends and Family:   . Frequency of Social Gatherings with Friends and Family:   . Attends Religious Services:   . Active Member of Clubs or Organizations:   . Attends Banker Meetings:   Marland Kitchen Marital Status:      Allergies  Allergen Reactions  . Other     Seasonal Allergies - itchy eyes, runny nose Dust- Takes allergy shots weekly (10/31/2019 - no longer taking)  . Lactase Nausea Only    abd pain  . Penicillins Rash    Physical Exam BP 122/78   Pulse 76   Ht 5\' 4"  (1.626 m)   Wt 172 lb 13.5 oz (78.4 kg)   BMI 29.67 kg/m  Gen: Awake, alert, not in distress Skin: No rash, No neurocutaneous stigmata. HEENT: Normocephalic, no dysmorphic features, no conjunctival injection, nares patent, mucous membranes moist, oropharynx clear. Neck: Supple, no meningismus. No focal tenderness. Resp: Clear to auscultation bilaterally CV: Regular rate, normal S1/S2, no murmurs, no rubs Abd: BS present, abdomen soft, non-tender, non-distended. No hepatosplenomegaly or mass Ext: Warm and well-perfused. No deformities, no muscle wasting, ROM full.  Neurological Examination: MS: Awake, alert, interactive. Normal eye contact, answered the questions appropriately, speech was fluent,  Normal comprehension.  Attention and concentration were normal. Cranial Nerves: Pupils were equal and reactive to light ( 5-48mm);  normal fundoscopic exam with sharp discs, visual field full with confrontation test; EOM normal, no nystagmus; no ptsosis, no double vision, intact facial sensation, face symmetric with full strength of facial muscles, hearing intact to finger rub bilaterally, palate elevation is symmetric, tongue protrusion is symmetric with full movement to both sides.  Sternocleidomastoid and trapezius are with normal strength. Tone-Normal Strength-Normal strength in all muscle groups DTRs-  Biceps  Triceps Brachioradialis Patellar Ankle  R 2+ 2+ 2+ 2+ 2+  L 2+ 2+ 2+ 2+ 2+   Plantar responses flexor bilaterally, no clonus noted Sensation: Intact to light touch,  Romberg negative. Coordination: No dysmetria on FTN test. No difficulty with balance. Gait: Normal walk and run. Tandem gait was normal. Was able to perform toe walking and heel walking without difficulty.   Assessment and Plan 1. Migraine without aura and without status migrainosus, not intractable   2. Neuritis   3. Episodic tension-type headache, not intractable   4. Neuropathic pain   5. Difficulty sleeping   6. Bruxism    This is a 19 year old female with multiple medical and psychological issues as mentioned in HPI who is still having frequent headaches and sleep difficulty on her current medications.  She is also having some GI issues with possible IBS for which she is going to follow-up with GI service.  She has no new findings on her neurological examination. I discussed with patient that it is very hard to increase or add another medication due to being on multiple medications and higher dose of medications  may cause drug drug interaction.  I think she needs to sleep better through the night to help with her headaches throughout the day. Although it is unclear if she would have any more side effects with more medications but I would start her on 0.2 mg of clonidine every night that may help with better sleep through the night and less headache throughout the day. She will continue the same dose of her other medications for now.  She needs to continue with more hydration throughout the day. She will continue follow-up with other services including psychiatry and GI service. I discussed with patient that she needs to continue making headache diary and she may take occasional Tylenol or ibuprofen or Imitrex but try not to use OTC medications frequently to prevent from rebound headaches. I would like to see her in 6 months  for follow-up visit or sooner if she develops more frequent headaches.  She understood and agreed with the plan.  Meds ordered this encounter  Medications  . gabapentin (NEURONTIN) 300 MG capsule    Sig: 2 capsules 2 times daily p.o.    Dispense:  120 capsule    Refill:  6  . SUMAtriptan (IMITREX) 50 MG tablet    Sig: May repeat in 2 hours if headache persists or recurs.    Dispense:  10 tablet    Refill:  1  . propranolol (INDERAL) 20 MG tablet    Sig: Take 2 tablets (40 mg total) by mouth 2 (two) times daily.    Dispense:  120 tablet    Refill:  6  . promethazine (PHENERGAN) 25 MG tablet    Sig: Take 1 tablet as needed for nausea or vomiting    Dispense:  30 tablet    Refill:  2  . cloNIDine (CATAPRES) 0.2 MG tablet    Sig: Take 1 tablet (0.2 mg total) by mouth at bedtime. One hr before sleep    Dispense:  30 tablet    Refill:  4

## 2020-04-18 ENCOUNTER — Ambulatory Visit (LOCAL_COMMUNITY_HEALTH_CENTER): Payer: Medicaid Other

## 2020-04-18 ENCOUNTER — Other Ambulatory Visit: Payer: Self-pay

## 2020-04-18 VITALS — BP 122/58 | Ht 63.0 in | Wt 172.5 lb

## 2020-04-18 DIAGNOSIS — Z3009 Encounter for other general counseling and advice on contraception: Secondary | ICD-10-CM

## 2020-04-18 DIAGNOSIS — Z30013 Encounter for initial prescription of injectable contraceptive: Secondary | ICD-10-CM

## 2020-04-18 NOTE — Progress Notes (Signed)
Depo administered without difficulty in left gluteus per client request and  per 10/31/19 written order of Samara Snide PA-C. Client tolerated without complaint. Jossie Ng, RN

## 2020-06-27 ENCOUNTER — Telehealth (INDEPENDENT_AMBULATORY_CARE_PROVIDER_SITE_OTHER): Payer: Self-pay | Admitting: Neurology

## 2020-06-27 NOTE — Telephone Encounter (Signed)
  Who's calling (name and relationship to patient) : [name] Best contact number: [phone] Provider they see: Nab Reason for call: Zen request a referral to the pain clinic at [facility] in [place].  Please call.     PRESCRIPTION REFILL ONLY  Name of prescription:  Pharmacy:

## 2020-06-27 NOTE — Telephone Encounter (Signed)
Spoke with patient and she stated that the pain clinic informed her to get the referral from whoever is currently managing her pain. She stated she hadn't been to her ped pain clinic since she was 16. I let her know that I could send this to Dr Devonne Doughty but he wouldn't be in until Thursday of next week, she said she was ok to wait. Please advise regarding referral

## 2020-07-02 NOTE — Telephone Encounter (Signed)
I called and there was no answer and the voicemail was not set up. Since she is above 18 she needs to get a referral from her PCP to see adult pain clinic and I am unable to do that.  Please call her and let her know.

## 2020-07-03 NOTE — Telephone Encounter (Signed)
Spoke to patient and she stated that her PCP doesn't accept medicaid anymore so she was hoping that Dr Devonne Doughty could make the referral until she could establish another PCP. I let her know that I would reach out to Dr. Merri Brunette and get back in touch with her

## 2020-07-07 ENCOUNTER — Other Ambulatory Visit: Payer: Self-pay

## 2020-07-07 ENCOUNTER — Ambulatory Visit (LOCAL_COMMUNITY_HEALTH_CENTER): Payer: Medicaid Other

## 2020-07-07 VITALS — BP 133/87 | Ht 63.0 in | Wt 166.0 lb

## 2020-07-07 DIAGNOSIS — Z3009 Encounter for other general counseling and advice on contraception: Secondary | ICD-10-CM

## 2020-07-07 DIAGNOSIS — Z30013 Encounter for initial prescription of injectable contraceptive: Secondary | ICD-10-CM | POA: Diagnosis not present

## 2020-07-09 NOTE — Progress Notes (Signed)
Depo given per Maximiano Coss PA 10/31/19 order; tolerated well. Richmond Campbell, RN

## 2020-08-19 ENCOUNTER — Other Ambulatory Visit: Payer: Self-pay

## 2020-08-19 ENCOUNTER — Encounter (INDEPENDENT_AMBULATORY_CARE_PROVIDER_SITE_OTHER): Payer: Self-pay | Admitting: Neurology

## 2020-08-19 ENCOUNTER — Ambulatory Visit (INDEPENDENT_AMBULATORY_CARE_PROVIDER_SITE_OTHER): Payer: Medicaid Other | Admitting: Neurology

## 2020-08-19 VITALS — BP 118/80 | HR 88 | Ht 64.17 in | Wt 168.7 lb

## 2020-08-19 DIAGNOSIS — G43009 Migraine without aura, not intractable, without status migrainosus: Secondary | ICD-10-CM

## 2020-08-19 DIAGNOSIS — G44219 Episodic tension-type headache, not intractable: Secondary | ICD-10-CM | POA: Diagnosis not present

## 2020-08-19 DIAGNOSIS — M792 Neuralgia and neuritis, unspecified: Secondary | ICD-10-CM

## 2020-08-19 DIAGNOSIS — G479 Sleep disorder, unspecified: Secondary | ICD-10-CM | POA: Diagnosis not present

## 2020-08-19 MED ORDER — GABAPENTIN 300 MG PO CAPS: ORAL_CAPSULE | ORAL | 6 refills | Status: AC

## 2020-08-19 MED ORDER — PROMETHAZINE HCL 25 MG PO TABS: ORAL_TABLET | ORAL | 2 refills | Status: DC

## 2020-08-19 MED ORDER — PROPRANOLOL HCL 20 MG PO TABS: 20.0000 mg | ORAL_TABLET | Freq: Two times a day (BID) | ORAL | 6 refills | Status: DC

## 2020-08-19 MED ORDER — AMITRIPTYLINE HCL 25 MG PO TABS
25.0000 mg | ORAL_TABLET | Freq: Every day | ORAL | 3 refills | Status: DC
Start: 2020-08-19 — End: 2020-12-22

## 2020-08-19 MED ORDER — CLONIDINE HCL 0.2 MG PO TABS: 0.2000 mg | ORAL_TABLET | Freq: Every day | ORAL | 4 refills | Status: DC

## 2020-08-19 NOTE — Patient Instructions (Signed)
Decrease the dose of gabapentin to 300 mg twice daily Decrease the dose of propanolol to 20 mg twice daily Continue the same dose of clonidine We will start 50 mg of amitriptyline every night Return in 4 months for follow-up visit

## 2020-08-19 NOTE — Progress Notes (Signed)
Patient: Leslie Orr MRN: [id number] Sex: female DOB: 28-Oct-2000  Provider: Keturah Shavers, MD Location of Care: [facility]  Note type: Routine return visit  Referral Source: Ronnette Juniper, MD History from: patient and Leslie Orr [facility] chart Chief Complaint: Headache  History of Present Illness: Leslie Orr is a 19 y.o. female is here for follow-up management of headache and musculoskeletal pain, sleep difficulty and anxiety issues. She has been seen for the past several years with multiple medical issues and complaints and she has been fairly stable over the past year on her current dose of medications although she has been seen and followed by behavioral service and also with pain clinic for her other issues. She was last seen in June and over the past few months she is still having headaches off and on and on a daily basis although she is not taking OTC medications frequently.  She is doing better with sleep through the night although if she is not taking her medication then she would have more problems with sleep. She has been seen and followed by pain service, GI and psychiatry service as well. Recently she decrease the dose of gabapentin from 600 mg twice daily to 300 mg twice daily to see how she does although she thinks that she may have more muscle pain and headache. Currently she is working throughout the day and she is not having any specific stress or anxiety issues although with taking all her medications. Most of her headaches are without any nausea or vomiting and usually she is having a type of chronic and persistent tension type headaches which might get worse if she would not sleep well at night or if she would have more anxiety.  She may occasionally take promethazine to help with headache and occasional nausea.  She is also complaining of significant fatigue and tiredness throughout the day.  Review of Systems: Review of system as per HPI, otherwise  negative.  Past Medical History:  Diagnosis Date  . Acid reflux   . ADHD   . Depression   . Hypertension   . Migraines   . Nausea    due to reflux  . Neuralgia    intercostal nerve pain at ribs  . Seasonal allergies    Hospitalizations: No., Head Injury: No., Nervous System Infections: No., Immunizations up to date: Yes.     Surgical History Past Surgical History:  Procedure Laterality Date  . BACK SURGERY  04/22/14 - 04/27/14   Scoliosis  . OTHER SURGICAL HISTORY  Nov. 2001   Pilori Stenosis Surgery    Family History family history includes ADD / ADHD in her brother; Anxiety disorder in her mother; Arthritis in her maternal grandmother; COPD in her paternal grandmother; Depression in her maternal grandmother and mother; Diabetes in her paternal grandmother; Drug abuse in her mother; Emphysema in her paternal grandmother; Heart Problems in her maternal grandfather and mother; Hyperlipidemia in her maternal grandmother; Migraines in her father and maternal grandmother; Other in her father; Seizures in her father.   Social History Social History   Socioeconomic History  . Marital status: Significant Other    Spouse name: Not on file  . Number of children: Not on file  . Years of education: Not on file  . Highest education level: Not on file  Occupational History  . Not on file  Tobacco Use  . Smoking status: Passive Smoke Exposure - Never Smoker  . Smokeless tobacco: Never Used  Vaping Use  .  Vaping Use: Some days  . Start date: 11/18/2018  . Substances: Nicotine  Substance and Sexual Activity  . Alcohol use: Never  . Drug use: Never  . Sexual activity: Yes    Birth control/protection: Abstinence, Condom, Injection  Other Topics Concern  . Not on file  Social History Narrative   She is working at [employer]. She lives with her grandparents, two brothers, and two friends. She enjoys Netflix, reading, and friends visiting.    Social Determinants of Health    Financial Resource Strain:   . Difficulty of Paying Living Expenses: Not on file  Food Insecurity:   . Worried About Programme researcher, broadcasting/film/video in the Last Year: Not on file  . Ran Out of Food in the Last Year: Not on file  Transportation Needs:   . Lack of Transportation (Medical): Not on file  . Lack of Transportation (Non-Medical): Not on file  Physical Activity:   . Days of Exercise per Week: Not on file  . Minutes of Exercise per Session: Not on file  Stress:   . Feeling of Stress : Not on file  Social Connections:   . Frequency of Communication with Friends and Family: Not on file  . Frequency of Social Gatherings with Friends and Family: Not on file  . Attends Religious Services: Not on file  . Active Member of Clubs or Organizations: Not on file  . Attends Banker Meetings: Not on file  . Marital Status: Not on file     Allergies  Allergen Reactions  . Other     Seasonal Allergies - itchy eyes, runny nose Dust- Takes allergy shots weekly (10/31/2019 - no longer taking)  . Lactase Nausea Only    abd pain  . Penicillins Rash    Physical Exam BP 118/80   Pulse 88   Ht 5' 4.17" (1.63 m)   Wt 168 lb 10.4 oz (76.5 kg)   BMI 28.79 kg/m  Gen: Awake, alert, not in distress, Non-toxic appearance. Skin: No neurocutaneous stigmata, no rash HEENT: Normocephalic, no dysmorphic features, no conjunctival injection, nares patent, mucous membranes moist, oropharynx clear. Neck: Supple, no meningismus, no lymphadenopathy,  Resp: Clear to auscultation bilaterally CV: Regular rate, normal S1/S2, no murmurs, no rubs Abd: Bowel sounds present, abdomen soft, non-tender, non-distended.  No hepatosplenomegaly or mass. Ext: Warm and well-perfused. No deformity, no muscle wasting, ROM full.  Neurological Examination: MS- Awake, alert, interactive Cranial Nerves- Pupils equal, round and reactive to light (5 to 19mm); fix and follows with full and smooth EOM; no nystagmus; no  ptosis, funduscopy with normal sharp discs, visual field full by looking at the toys on the side, face symmetric with smile.  Hearing intact to bell bilaterally, palate elevation is symmetric, and tongue protrusion is symmetric. Tone- Normal Strength-Seems to have good strength, symmetrically by observation and passive movement. Reflexes-    Biceps Triceps Brachioradialis Patellar Ankle  R 2+ 2+ 2+ 2+ 2+  L 2+ 2+ 2+ 2+ 2+   Plantar responses flexor bilaterally, no clonus noted Sensation- Withdraw at four limbs to stimuli. Coordination- Reached to the object with no dysmetria Gait: Normal walk without any coordination or balance issues.   Assessment and Plan 1. Neuritis   2. Migraine without aura and without status migrainosus, not intractable   3. Episodic tension-type headache, not intractable   4. Difficulty sleeping   5. Neuropathic pain    This is a 19 year old female with multiple medical issues as mentioned in  HPI, on multiple different medications with fairly stable condition over the past few months although she is still having frequent chronic and persistent headache as well as occasional musculoskeletal pain and occasional sleep difficulty.  She is going to find another pain clinic to help her with her musculoskeletal pain. I discussed with patient that I think it would be a good idea to continue with lower dose of gabapentin at 300 mg twice daily for a while although if she develops significant more neuropathic pain, she may go back to the previous dose of medication. I would start her on 50 mg of amitriptyline which she was in the past to help her with headache and also it may help with musculoskeletal pain and sleeps through the night and also help with anxiety.  If she develops any side effects or interaction with other medications then she may discontinue medication. I also think that she may benefit from decreasing the dose of propranolol to 20 mg twice daily since she is  complaining of significant tiredness and fatigue which could be side effect of high-dose propranolol. She needs to continue follow-up with other services and pain clinic if she develops more musculoskeletal and neuropathic pain Also I told her that as soon as she see her new PCP, she may get a referral to see adult neurology and then will transfer her care to adult neurology.  She understood and agreed. I will make a follow-up appointment for about 4 months unless she would find the adult neurology then we can transfer her care.  Meds ordered this encounter  Medications  . propranolol (INDERAL) 20 MG tablet    Sig: Take 1 tablet (20 mg total) by mouth 2 (two) times daily.    Dispense:  60 tablet    Refill:  6  . cloNIDine (CATAPRES) 0.2 MG tablet    Sig: Take 1 tablet (0.2 mg total) by mouth at bedtime. One hr before sleep    Dispense:  30 tablet    Refill:  4  . gabapentin (NEURONTIN) 300 MG capsule    Sig: 2 capsules 2 times daily p.o.    Dispense:  120 capsule    Refill:  6  . amitriptyline (ELAVIL) 25 MG tablet    Sig: Take 1 tablet (25 mg total) by mouth at bedtime.    Dispense:  30 tablet    Refill:  3  . promethazine (PHENERGAN) 25 MG tablet    Sig: Take 1 tablet as needed for nausea or vomiting    Dispense:  30 tablet    Refill:  2

## 2020-09-23 ENCOUNTER — Other Ambulatory Visit: Payer: Self-pay

## 2020-09-23 ENCOUNTER — Ambulatory Visit (LOCAL_COMMUNITY_HEALTH_CENTER): Payer: Medicaid Other

## 2020-09-23 VITALS — BP 115/80 | Ht 63.0 in | Wt 166.0 lb

## 2020-09-23 DIAGNOSIS — Z30013 Encounter for initial prescription of injectable contraceptive: Secondary | ICD-10-CM

## 2020-09-23 DIAGNOSIS — Z3009 Encounter for other general counseling and advice on contraception: Secondary | ICD-10-CM | POA: Diagnosis not present

## 2020-09-23 MED ORDER — MEDROXYPROGESTERONE ACETATE 150 MG/ML IM SUSP
150.0000 mg | Freq: Once | INTRAMUSCULAR | Status: AC
  Administered 2020-09-23: 150 mg via INTRAMUSCULAR

## 2020-09-23 NOTE — Progress Notes (Signed)
Depo given per Maximiano Coss PA order (Jan 2021).; tolerated well Richmond Campbell, RN

## 2020-10-06 ENCOUNTER — Telehealth: Payer: Self-pay | Admitting: Student in an Organized Health Care Education/Training Program

## 2020-10-06 NOTE — Telephone Encounter (Signed)
Please advise. Thank you

## 2020-10-06 NOTE — Telephone Encounter (Signed)
Referring Physician Cyndia Diver would like to know why new patient referral was denied.  Also would like to speak with Dr. Cherylann Ratel regarding this patient.   Phone [phone] Fax  [phone]

## 2020-10-06 NOTE — Telephone Encounter (Signed)
Please schedule new patient visit to discuss interventional options for chronic pain (intercostal nerve block vs TPI). Avoid chronic opioid therapy.  Thanks, Edward Jolly

## 2020-10-22 ENCOUNTER — Encounter: Payer: Self-pay | Admitting: Student in an Organized Health Care Education/Training Program

## 2020-10-22 ENCOUNTER — Ambulatory Visit
Payer: Medicaid Other | Attending: Student in an Organized Health Care Education/Training Program | Admitting: Student in an Organized Health Care Education/Training Program

## 2020-10-22 ENCOUNTER — Other Ambulatory Visit: Payer: Self-pay

## 2020-10-22 VITALS — BP 126/70 | HR 87 | Temp 97.5°F | Resp 18 | Ht 64.0 in | Wt 165.0 lb

## 2020-10-22 DIAGNOSIS — G43009 Migraine without aura, not intractable, without status migrainosus: Secondary | ICD-10-CM | POA: Insufficient documentation

## 2020-10-22 DIAGNOSIS — Z981 Arthrodesis status: Secondary | ICD-10-CM | POA: Insufficient documentation

## 2020-10-22 DIAGNOSIS — M41114 Juvenile idiopathic scoliosis, thoracic region: Secondary | ICD-10-CM | POA: Diagnosis present

## 2020-10-22 DIAGNOSIS — M792 Neuralgia and neuritis, unspecified: Secondary | ICD-10-CM | POA: Diagnosis not present

## 2020-10-22 DIAGNOSIS — G588 Other specified mononeuropathies: Secondary | ICD-10-CM | POA: Diagnosis not present

## 2020-10-22 DIAGNOSIS — G894 Chronic pain syndrome: Secondary | ICD-10-CM | POA: Diagnosis present

## 2020-10-22 NOTE — Progress Notes (Signed)
Safety precautions to be maintained throughout the outpatient stay will include: orient to surroundings, keep bed in low position, maintain call bell within reach at all times, provide assistance with transfer out of bed and ambulation.  

## 2020-10-22 NOTE — Progress Notes (Signed)
Patient: Leslie Orr  Service Category: E/M  Provider: Bilal Lateef, MD  DOB: 03/27/2001  DOS: 10/22/2020  Referring Provider: Pringle, Joseph, MD  MRN: [id number]  Setting: Ambulatory outpatient  PCP: Bounvilay, Celena, PA-C  Type: New Patient  Specialty: Interventional Pain Management    Location: Office  Delivery: Face-to-face     Primary Reason(s) for Visit: Encounter for initial evaluation of one or more chronic problems (new to examiner) potentially causing chronic pain, and posing a threat to normal musculoskeletal function. (Level of risk: High) CC: Pain (Right sided rib pain )  HPI  Leslie Orr is a 19 y.o. year old, female patient, who comes for the first time to our practice referred by Pringle, Joseph, MD for our initial evaluation of her chronic pain. She has Migraine without aura and without status migrainosus, not intractable; Tension headache; Idiopathic scoliosis; Episodic tension-type headache, not intractable; Neuropathic pain; Bruxism; Neuritis; Sleeping difficulty; Depression; Gastroesophageal reflux disease; ADHD; S/P lumbar spinal fusion; Intercostal neuralgia (right side); and Chronic pain syndrome on their problem list. Today she comes in for evaluation of her Pain (Right sided rib pain )  Pain Assessment: Location: Right Rib cage Radiating: radiated at times into the right hip area Onset:   Duration: Chronic pain Quality: Constant,Sharp,Aching,Spasm,Burning Severity: 7 /10 (subjective, self-reported pain score)  Effect on ADL: "Working is very hard, especially because of muscle spasm in shoulder blade, burns when trying to straighten back" Timing: Constant Modifying factors: "Muscle reaxler but that only helps me sleep. I try heat, ice, rest, tylenol, ibuprofen" BP: 126/70  HR: 87  Onset and Duration: Sudden Cause of pain: Unknown Severity: Getting worse, NAS-11 at its worse: 10/10, NAS-11 at its best: 4/10, NAS-11 now: 7/10 and NAS-11 on the average:  7/10 Timing: Not influenced by the time of the day, During activity or exercise and After activity or exercise Aggravating Factors: Bending, Kneeling, Lifiting, Motion, Prolonged sitting, Prolonged standing, Squatting, Walking, Walking uphill and Working Alleviating Factors: Lying down, Medications, Nerve blocks, Sleeping and TENS Associated Problems: Constipation, Depression, Fatigue, Inability to concentrate, Nausea, Spasms, Sweating, Vomiting , Pain that wakes patient up and Pain that does not allow patient to sleep Quality of Pain: Aching, Annoying, Burning, Constant, Cramping, Deep, Disabling, Dreadful, Exhausting, Itching, Nagging, Pressure-like, Pulsating, Sharp, Shooting, Stabbing, Tender, Throbbing, Tiring and Uncomfortable Previous Examinations or Tests: CT scan, Endoscopy, Nerve block, X-rays, Neurological evaluation and Psychiatric evaluation Previous Treatments: Physical Therapy, Pool exercises, Stretching exercises, TENS and Trigger point injections  Leslie Orr is a pleasant 19-year-old female with a chief complaint of thoracic pain that wraps around her ribs in a dermatomal fashion secondary to intercostal neuralgia.  Of note patient has a history of idiopathic scoliosis for which she had spinal fusion done at the age of 11.  She has had mid back, low back pain that radiates into her anterior region that has been going on for the last 3 to 4 years.  She has been evaluated by many specialists in the past including neurology as well as pediatric pain specialist at [facility].  She has had trigger point injections as well as intercostal nerve blocks which did provide her with analgesic benefit.  She has tried various medications in the past including neuropathic such as gabapentin and Lyrica which were not very effective.  She does utilize Flexeril for insomnia.  She utilizes acetaminophen, ibuprofen, heat, ice, rest.  She has done physical therapy in the past as well as aquatic therapy.  She describes her  pain as   burning, tingling, severe.  She is interested in repeating intercostal nerve block.  She has had 2 done on the right side at T9, T11 10, T11 on 01/30/2018 and 05/12/2018 respectively.  Meds   Current Outpatient Medications:  .  amitriptyline (ELAVIL) 25 MG tablet, Take 1 tablet (25 mg total) by mouth at bedtime., Disp: 30 tablet, Rfl: 3 .  azelastine (ASTELIN) 0.1 % nasal spray, ONE SPRAY EACH NOSTRIL EVERY 12 HOUR (Patient not taking: Reported on 10/22/2020), Disp: , Rfl: 11 .  buPROPion (WELLBUTRIN XL) 150 MG 24 hr tablet, Take 150 mg by mouth., Disp: , Rfl:  .  cetirizine (ZYRTEC) 10 MG tablet, Take 10 mg by mouth daily. (Patient not taking: Reported on 10/22/2020), Disp: , Rfl:  .  cloNIDine (CATAPRES) 0.2 MG tablet, Take 1 tablet (0.2 mg total) by mouth at bedtime. One hr before sleep, Disp: 30 tablet, Rfl: 4 .  cyclobenzaprine (FLEXERIL) 10 MG tablet, TAKE 1 TABLET BY MOUTH EVERYDAY AT BEDTIME, Disp: 30 tablet, Rfl: 6 .  DULoxetine (CYMBALTA) 30 MG capsule, Take 90 mg by mouth daily. (Patient not taking: Reported on 10/22/2020), Disp: , Rfl:  .  DULoxetine (CYMBALTA) 60 MG capsule, 1 CAPSULE BY MOUTH DAILY WITH 30MG DULOXETINE, Disp: , Rfl: 3 .  EPIPEN 2-PAK 0.3 MG/0.3ML SOAJ injection, See admin instructions., Disp: , Rfl: 12 .  esomeprazole (NEXIUM) 40 MG capsule, Take 40 mg by mouth 2 (two) times daily., Disp: , Rfl:  .  fluticasone (FLONASE) 50 MCG/ACT nasal spray, SPRAY 1 SPRAY INTO EACH NOSTRIL EVERY DAY (Patient not taking: No sig reported), Disp: , Rfl:  .  gabapentin (NEURONTIN) 300 MG capsule, 2 capsules 2 times daily p.o., Disp: 120 capsule, Rfl: 6 .  loperamide (IMODIUM A-D) 2 MG tablet, Take 1 tablet (2 mg total) by mouth every other day. (Patient not taking: No sig reported), Disp: 20 tablet, Rfl: 0 .  magnesium gluconate (MAGONATE) 500 MG tablet, Take 500 mg by mouth 2 (two) times daily., Disp: , Rfl:  .  methylphenidate 36 MG PO CR tablet, Take 72 mg by mouth daily., Disp: ,  Rfl:  .  montelukast (SINGULAIR) 10 MG tablet, Take 10 mg by mouth., Disp: , Rfl:  .  Multiple Vitamins-Minerals (MULTIVITAMIN WITH MINERALS) tablet, Take 1 tablet by mouth daily., Disp: 100 tablet, Rfl: 0 .  ondansetron (ZOFRAN-ODT) 4 MG disintegrating tablet, TAKE 1 TABLET (4 MG TOTAL) BY MOUTH EVERY EIGHT (8) HOURS AS NEEDED FOR NAUSEA., Disp: , Rfl:  .  pantoprazole (PROTONIX) 20 MG tablet, Take 1 tablet (20 mg total) by mouth daily. (Patient not taking: Reported on 10/22/2020), Disp: 30 tablet, Rfl: 1 .  predniSONE (DELTASONE) 20 MG tablet, 60 mg qam for 2 days, 40 mg qam for 2 days, 20 mgqam for 2 days PO (Patient not taking: No sig reported), Disp: 12 tablet, Rfl: 0 .  promethazine (PHENERGAN) 25 MG tablet, Take 1 tablet as needed for nausea or vomiting, Disp: 30 tablet, Rfl: 2 .  propranolol (INDERAL) 20 MG tablet, Take 1 tablet (20 mg total) by mouth 2 (two) times daily., Disp: 60 tablet, Rfl: 6 .  ranitidine (ZANTAC) 150 MG tablet, TAKE 1 TABLET (150 MG TOTAL) BY MOUTH BID (Patient not taking: No sig reported), Disp: , Rfl: 2 .  riboflavin (VITAMIN B-2) 100 MG TABS tablet, Take 100 mg by mouth daily., Disp: , Rfl:  .  SUMAtriptan (IMITREX) 50 MG tablet, May repeat in 2 hours if headache persists or recurs., Disp:   10 tablet, Rfl: 1   ROS  Cardiovascular: High blood pressure Pulmonary or Respiratory: Smoking and Snoring  Neurological: Curved spine Psychological-Psychiatric: Psychiatric disorder, Anxiousness, Depressed and Difficulty sleeping and or falling asleep Gastrointestinal: Reflux or heatburn, Alternating episodes iof diarrhea and constipation (IBS-Irritable bowe syndrome) and Irregular, infrequent bowel movements (Constipation) Genitourinary: No reported renal or genitourinary signs or symptoms such as difficulty voiding or producing urine, peeing blood, non-functioning kidney, kidney stones, difficulty emptying the bladder, difficulty controlling the flow of urine, or chronic kidney  disease Hematological: No reported hematological signs or symptoms such as prolonged bleeding, low or poor functioning platelets, bruising or bleeding easily, hereditary bleeding problems, low energy levels due to low hemoglobin or being anemic Endocrine: No reported endocrine signs or symptoms such as high or low blood sugar, rapid heart rate due to high thyroid levels, obesity or weight gain due to slow thyroid or thyroid disease Rheumatologic: No reported rheumatological signs and symptoms such as fatigue, joint pain, tenderness, swelling, redness, heat, stiffness, decreased range of motion, with or without associated rash Musculoskeletal: Negative for myasthenia gravis, muscular dystrophy, multiple sclerosis or malignant hyperthermia Work History: Quit going to work on his/her own  Allergies  Ms. Paradise is allergic to other, lactase, and penicillins.  Laboratory Chemistry Profile   Renal Lab Results  Component Value Date   BUN 7 03/03/2020   CREATININE 0.57 03/03/2020   BCR NOT APPLICABLE 08/03/2017   GFRAA >60 03/03/2020   GFRNONAA >60 03/03/2020   PROTEINUR NEGATIVE 03/03/2020     Electrolytes Lab Results  Component Value Date   NA 137 03/03/2020   K 4.1 03/03/2020   CL 106 03/03/2020   CALCIUM 9.6 03/03/2020   MG 2.0 08/03/2017     Hepatic Lab Results  Component Value Date   AST 18 03/03/2020   ALT 16 03/03/2020   ALBUMIN 4.5 03/03/2020   ALKPHOS 82 03/03/2020   LIPASE 20 03/03/2020     ID Lab Results  Component Value Date   PREGTESTUR NEGATIVE 03/03/2020     Bone Lab Results  Component Value Date   VD25OH 27 (L) 08/03/2017     Endocrine Lab Results  Component Value Date   GLUCOSE 96 03/03/2020   GLUCOSEU NEGATIVE 03/03/2020   TSH 0.84 08/03/2017     Neuropathy No results found for: VITAMINB12, FOLATE, HGBA1C, HIV   CNS No results found for: COLORCSF, APPEARCSF, RBCCOUNTCSF, WBCCSF, POLYSCSF, LYMPHSCSF, EOSCSF, PROTEINCSF, GLUCCSF, JCVIRUS,  CSFOLI, IGGCSF, LABACHR, ACETBL, LABACHR, ACETBL   Inflammation (CRP: Acute  ESR: Chronic) Lab Results  Component Value Date   ESRSEDRATE 4 02/27/2015     Rheumatology Lab Results  Component Value Date   ANA NEG 02/27/2015     Coagulation Lab Results  Component Value Date   PLT 329 03/03/2020     Cardiovascular Lab Results  Component Value Date   CKTOTAL 132 02/27/2015   HGB 13.1 03/03/2020   HCT 39.2 03/03/2020     Screening Lab Results  Component Value Date   PREGTESTUR NEGATIVE 03/03/2020     Cancer No results found for: CEA, CA125, LABCA2   Allergens No results found for: ALMOND, APPLE, ASPARAGUS, AVOCADO, BANANA, BARLEY, BASIL, BAYLEAF, GREENBEAN, LIMABEAN, WHITEBEAN, BEEFIGE, REDBEET, BLUEBERRY, BROCCOLI, CABBAGE, MELON, CARROT, CASEIN, CASHEWNUT, CAULIFLOWER, CELERY     Note: Lab results reviewed.  PFSH  Drug: Ms. Pinkstaff  reports no history of drug use. Alcohol:  reports no history of alcohol use. Tobacco:  reports that she is a non-smoker but has   been exposed to tobacco smoke. She has never used smokeless tobacco. Medical:  has a past medical history of Acid reflux, ADHD, Depression, Hypertension, Migraines, Nausea, Neuralgia, and Seasonal allergies. Family: family history includes ADD / ADHD in her brother; Anxiety disorder in her mother; Arthritis in her maternal grandmother; COPD in her paternal grandmother; Depression in her maternal grandmother and mother; Diabetes in her paternal grandmother; Drug abuse in her mother; Emphysema in her paternal grandmother; Heart Problems in her maternal grandfather and mother; Hyperlipidemia in her maternal grandmother; Migraines in her father and maternal grandmother; Other in her father; Seizures in her father.  Past Surgical History:  Procedure Laterality Date  . BACK SURGERY  04/22/14 - 04/27/14   Scoliosis  . OTHER SURGICAL HISTORY  Nov. 2001   [facility] Stenosis Surgery   Active Ambulatory Problems    Diagnosis  Date Noted  . Migraine without aura and without status migrainosus, not intractable 02/21/2014  . Tension headache 02/21/2014  . Idiopathic scoliosis 02/21/2014  . Episodic tension-type headache, not intractable 07/19/2014  . Neuropathic pain 02/27/2015  . Bruxism 02/11/2016  . Neuritis 02/11/2016  . Sleeping difficulty 01/28/2017  . Depression 02/14/2015  . Gastroesophageal reflux disease 02/14/2015  . ADHD 01/11/2014  . S/P lumbar spinal fusion 10/22/2020  . 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 Chronic pain syndrome 10/22/2020   Resolved Ambulatory Problems    Diagnosis Date Noted  . No Resolved Ambulatory Problems   Past Medical History:  Diagnosis Date  . Acid reflux   . Hypertension   . Migraines   . Nausea   . Neuralgia   . Seasonal allergies    Constitutional Exam  General appearance: Well nourished, well developed, and well hydrated. In no apparent acute distress Vitals:   10/22/20 1316 10/22/20 1318  BP:  126/70  Pulse:  87  Resp: 18 18  Temp:  (!) 97.5 F (36.4 C)  TempSrc:  Temporal  SpO2:  99%  Weight: 165 lb (74.8 kg)   Height: 5' 4" (1.626 m)    BMI Assessment: Estimated body mass index is 28.32 kg/m as calculated from the following:   Height as of this encounter: 5' 4" (1.626 m).   Weight as of this encounter: 165 lb (74.8 kg).  BMI interpretation table: BMI level Category Range association with higher incidence of chronic pain  <18 kg/m2 Underweight   18.5-24.9 kg/m2 Ideal body weight   25-29.9 kg/m2 Overweight Increased incidence by 20%  30-34.9 kg/m2 Obese (Class I) Increased incidence by 68%  35-39.9 kg/m2 Severe obesity (Class II) Increased incidence by 136%  >40 kg/m2 Extreme obesity (Class III) Increased incidence by 254%   Patient's current BMI Ideal Body weight  Body mass index is 28.32 kg/m. Ideal body weight: 54.7 kg (120 lb 9.5 oz) Adjusted ideal body weight: 62.8 kg (138 lb 5.7 oz)   BMI Readings from Last 4  Encounters:  10/22/20 28.32 kg/m  09/23/20 29.41 kg/m  08/19/20 28.79 kg/m  07/09/20 29.41 kg/m (92 %, Z= 1.42)*   * Growth percentiles are based on CDC (Girls, 2-20 Years) data.   Wt Readings from Last 4 Encounters:  10/22/20 165 lb (74.8 kg)  09/23/20 166 lb (75.3 kg)  08/19/20 168 lb 10.4 oz (76.5 kg)  07/09/20 166 lb (75.3 kg) (90 %, Z= 1.28)*   * Growth percentiles are based on CDC (Girls, 2-20 Years) data.    Psych/Mental status: Alert, oriented x 3 (person, place, & time)  Eyes: PERLA Respiratory: No evidence of acute respiratory distress    Upper Extremity (UE) Exam    Side: Right upper extremity  Side: Left upper extremity  Skin & Extremity Inspection: Skin color, temperature, and hair growth are WNL. No peripheral edema or cyanosis. No masses, redness, swelling, asymmetry, or associated skin lesions. No contractures.  Skin & Extremity Inspection: Skin color, temperature, and hair growth are WNL. No peripheral edema or cyanosis. No masses, redness, swelling, asymmetry, or associated skin lesions. No contractures.  Functional ROM: Unrestricted ROM          Functional ROM: Unrestricted ROM          Muscle Tone/Strength: Functionally intact. No obvious neuro-muscular anomalies detected.   Muscle Tone/Strength: Functionally intact. No obvious neuro-muscular anomalies detected.  Sensory (Neurological): Unimpaired          Sensory (Neurological): Unimpaired          Palpation: No palpable anomalies              Palpation: No palpable anomalies              Provocative Test(s):  Phalen's test: deferred Tinel's test: deferred Apley's scratch test (touch opposite shoulder):  Action 1 (Across chest): deferred Action 2 (Overhead): deferred Action 3 (LB reach): deferred   Provocative Test(s):  Phalen's test: deferred Tinel's test: deferred Apley's scratch test (touch opposite shoulder):  Action 1 (Across chest): deferred Action 2 (Overhead): deferred Action 3 (LB  reach): deferred    Thoracic Spine Area Exam  Skin & Axial Inspection: Well healed scar from previous spine surgery detected Alignment: Symmetrical Functional ROM: Pain restricted ROM Stability: No instability detected Muscle Tone/Strength: Functionally intact. No obvious neuro-muscular anomalies detected. Sensory (Neurological): Neuropathic pain pattern  Lumbar Exam  Skin & Axial Inspection: Well healed scar from previous spine surgery detected Alignment: Symmetrical Functional ROM: Pain restricted ROM affecting both sides right greater than left Stability: No instability detected Muscle Tone/Strength: Functionally intact. No obvious neuro-muscular anomalies detected. Sensory (Neurological): Neuropathic pain pattern Palpation: No palpable anomalies         Gait & Posture Assessment  Ambulation: Unassisted Gait: Relatively normal for age and body habitus Posture: WNL   Lower Extremity Exam    Side: Right lower extremity  Side: Left lower extremity  Stability: No instability observed          Stability: No instability observed          Skin & Extremity Inspection: Skin color, temperature, and hair growth are WNL. No peripheral edema or cyanosis. No masses, redness, swelling, asymmetry, or associated skin lesions. No contractures.  Skin & Extremity Inspection: Skin color, temperature, and hair growth are WNL. No peripheral edema or cyanosis. No masses, redness, swelling, asymmetry, or associated skin lesions. No contractures.  Functional ROM: Unrestricted ROM                  Functional ROM: Unrestricted ROM                  Muscle Tone/Strength: Functionally intact. No obvious neuro-muscular anomalies detected.  Muscle Tone/Strength: Functionally intact. No obvious neuro-muscular anomalies detected.  Sensory (Neurological): Unimpaired        Sensory (Neurological): Unimpaired        DTR: Patellar: deferred today Achilles: deferred today Plantar: deferred today  DTR: Patellar:  deferred today Achilles: deferred today Plantar: deferred today  Palpation: No palpable anomalies  Palpation: No palpable anomalies   Assessment  Primary Diagnosis &   Pertinent Problem List: The primary encounter diagnosis was Juvenile idiopathic scoliosis of thoracic region. Diagnoses of S/P lumbar spinal fusion, Intercostal neuralgia (right side), Neuropathic pain, Migraine without aura and without status migrainosus, not intractable, and Chronic pain syndrome were also pertinent to this visit.  Visit Diagnosis (New problems to examiner): 1. Juvenile idiopathic scoliosis of thoracic region   2. S/P lumbar spinal fusion   3. Intercostal neuralgia (right side)   4. Neuropathic pain   5. Migraine without aura and without status migrainosus, not intractable   6. Chronic pain syndrome    Plan of Care (Initial workup plan)   Procedure Orders     INTERCOSTAL NERVE BLOCK  I had extensive discussion with Ova regarding treatment options here.  She has seen many specialists in the past and has tried various therapies including physical therapy (both land-based and aquatic), various medication trials including neuropathic's, muscle relaxers, NSAIDs, acetaminophen and narcotics with limited benefit.  She has a history of migraines as well for which she sees neurology.  She has had some benefit with intercostal nerve blocks in the past previously done on 01/30/2018 and 05/12/2018 on the right side at T9, T10, T11 at [facility].  We discussed repeating intercostal nerve blocks and considering intercostal nerve radiofrequency ablation versus intercostal peripheral nerve stimulation.  Risks and benefits of intercostal nerve block were discussed with patient and she will follow-up in 1 to 2 weeks to have that done.  Orders Placed This Encounter  Procedures  . INTERCOSTAL NERVE BLOCK    Standing Status:   Future    Standing Expiration Date:   04/21/2021    Scheduling Instructions:     Side: Right T9 , T10, T11      Sedation: With PO valium     Timeframe: ASAA    Order Specific Question:   Where will this procedure be performed?    Answer:   [facility]     Provider-requested follow-up: Return in about 2 weeks (around 11/05/2020) for R T9, T10, T11 ICNB with PO Valium.  Future Appointments  Date Time Provider Department Center  12/29/2020  4:00 PM Nabizadeh, Reza, MD [facility] None    Note by: Bilal Lateef, MD Date: 10/22/2020; Time: 3:09 PM 

## 2020-11-06 ENCOUNTER — Emergency Department
Admission: EM | Admit: 2020-11-06 | Discharge: 2020-11-06 | Disposition: A | Discharge status: Home / Self Care | Payer: Medicaid Other | Attending: Emergency Medicine | Admitting: Emergency Medicine

## 2020-11-06 ENCOUNTER — Encounter: Payer: Self-pay | Admitting: Emergency Medicine

## 2020-11-06 ENCOUNTER — Other Ambulatory Visit: Payer: Self-pay

## 2020-11-06 DIAGNOSIS — R197 Diarrhea, unspecified: Secondary | ICD-10-CM | POA: Diagnosis not present

## 2020-11-06 DIAGNOSIS — R112 Nausea with vomiting, unspecified: Secondary | ICD-10-CM | POA: Diagnosis not present

## 2020-11-06 DIAGNOSIS — Z5321 Procedure and treatment not carried out due to patient leaving prior to being seen by health care provider: Secondary | ICD-10-CM | POA: Insufficient documentation

## 2020-11-06 LAB — COMPREHENSIVE METABOLIC PANEL
ALT: 38 U/L (ref 0–44)
AST: 29 U/L (ref 15–41)
Albumin: 5 g/dL (ref 3.5–5.0)
Alkaline Phosphatase: 74 U/L (ref 38–126)
Anion gap: 13 (ref 5–15)
BUN: 12 mg/dL (ref 6–20)
CO2: 24 mmol/L (ref 22–32)
Calcium: 10.3 mg/dL (ref 8.9–10.3)
Chloride: 101 mmol/L (ref 98–111)
Creatinine, Ser: 0.73 mg/dL (ref 0.44–1.00)
GFR, Estimated: >60 mL/min (ref >60)
Glucose, Bld: 100 mg/dL — ABNORMAL HIGH (ref 70–99)
Potassium: 4.5 mmol/L (ref 3.5–5.1)
Sodium: 138 mmol/L (ref 135–145)
Total Bilirubin: 0.5 mg/dL (ref 0.3–1.2)
Total Protein: 8.8 g/dL — ABNORMAL HIGH (ref 6.5–8.1)

## 2020-11-06 LAB — LIPASE, BLOOD: Lipase: 22 U/L (ref 11–51)

## 2020-11-06 LAB — CBC
HCT: 47.6 % — ABNORMAL HIGH (ref 36.0–46.0)
Hemoglobin: 15.3 g/dL — ABNORMAL HIGH (ref 12.0–15.0)
MCH: 28.9 pg (ref 26.0–34.0)
MCHC: 32.1 g/dL (ref 30.0–36.0)
MCV: 89.8 fL (ref 80.0–100.0)
Platelets: 358 10*3/uL (ref 150–400)
RBC: 5.3 MIL/uL — ABNORMAL HIGH (ref 3.87–5.11)
RDW: 14.8 % (ref 11.5–15.5)
WBC: 16.1 10*3/uL — ABNORMAL HIGH (ref 4.0–10.5)
nRBC: 0 % (ref 0.0–0.2)

## 2020-11-06 MED ORDER — ONDANSETRON 4 MG PO TBDP
4.0000 mg | ORAL_TABLET | Freq: Once | ORAL | Status: AC | PRN
  Administered 2020-11-06: 4 mg via ORAL
  Filled 2020-11-06: qty 1

## 2020-11-06 NOTE — ED Notes (Signed)
Called patient in [place] to reassess VS.  NO answer.  Unable to locate

## 2020-11-06 NOTE — ED Notes (Signed)
Attempt made to speak with Legal guardian, Levell July.  No answer.

## 2020-11-06 NOTE — ED Triage Notes (Signed)
Pt comes into the ED via [facility] from home c/o N/V/D, chills, and cold sweats.  PT states symptoms started 2 hours ago.  Pt denies being around anyone that is COVID positive.  Pt has h/o IBS and GERD.  Pt states she has vomited x3 times today. Pt in NAD at this time with even and unlabored respirations.  Pt ambulatory to triage.  Legal gaurdian attempted to be called, but no answer.

## 2020-11-06 NOTE — ED Triage Notes (Signed)
First Nurse Note:  Arrives via [facility] with c/o N/V/D x 2 hours.  Mother had similar symptoms 2 days ago which have resolved.  Per report, vs wnl.

## 2020-11-12 ENCOUNTER — Ambulatory Visit (HOSPITAL_BASED_OUTPATIENT_CLINIC_OR_DEPARTMENT_OTHER): Payer: Medicaid Other | Admitting: Student in an Organized Health Care Education/Training Program

## 2020-11-12 ENCOUNTER — Encounter: Payer: Self-pay | Admitting: Student in an Organized Health Care Education/Training Program

## 2020-11-12 ENCOUNTER — Other Ambulatory Visit: Payer: Self-pay

## 2020-11-12 ENCOUNTER — Ambulatory Visit
Admission: RE | Admit: 2020-11-12 | Discharge: 2020-11-12 | Disposition: A | Discharge status: Home / Self Care | Payer: Medicaid Other | Source: Ambulatory Visit | Attending: Student in an Organized Health Care Education/Training Program | Admitting: Student in an Organized Health Care Education/Training Program

## 2020-11-12 DIAGNOSIS — M792 Neuralgia and neuritis, unspecified: Secondary | ICD-10-CM

## 2020-11-12 DIAGNOSIS — G894 Chronic pain syndrome: Secondary | ICD-10-CM | POA: Diagnosis present

## 2020-11-12 DIAGNOSIS — G588 Other specified mononeuropathies: Secondary | ICD-10-CM

## 2020-11-12 MED ORDER — DEXAMETHASONE SODIUM PHOSPHATE 10 MG/ML IJ SOLN
INTRAMUSCULAR | Status: AC
  Filled 2020-11-12: qty 1

## 2020-11-12 MED ORDER — IOHEXOL 180 MG/ML  SOLN
10.0000 mL | Freq: Once | INTRAMUSCULAR | Status: AC
  Administered 2020-11-12: 10 mL via INTRA_ARTICULAR

## 2020-11-12 MED ORDER — ROPIVACAINE HCL 2 MG/ML IJ SOLN
INTRAMUSCULAR | Status: AC
  Filled 2020-11-12: qty 10

## 2020-11-12 MED ORDER — DEXAMETHASONE SODIUM PHOSPHATE 10 MG/ML IJ SOLN
10.0000 mg | Freq: Once | INTRAMUSCULAR | Status: AC
  Administered 2020-11-12: 10 mg

## 2020-11-12 MED ORDER — DIAZEPAM 5 MG PO TABS
5.0000 mg | ORAL_TABLET | Freq: Once | ORAL | Status: AC
  Administered 2020-11-12: 5 mg via ORAL

## 2020-11-12 MED ORDER — DIAZEPAM 5 MG PO TABS
ORAL_TABLET | ORAL | Status: AC
  Filled 2020-11-12: qty 1

## 2020-11-12 MED ORDER — ROPIVACAINE HCL 2 MG/ML IJ SOLN
9.0000 mL | Freq: Once | INTRAMUSCULAR | Status: AC
  Administered 2020-11-12: 9 mL via PERINEURAL

## 2020-11-12 MED ORDER — LIDOCAINE HCL 2 % IJ SOLN
20.0000 mL | Freq: Once | INTRAMUSCULAR | Status: AC
  Administered 2020-11-12: 100 mg
  Filled 2020-11-12: qty 20

## 2020-11-12 NOTE — Progress Notes (Signed)
Safety precautions to be maintained throughout the outpatient stay will include: orient to surroundings, keep bed in low position, maintain call bell within reach at all times, provide assistance with transfer out of bed and ambulation.  

## 2020-11-12 NOTE — Progress Notes (Signed)
PROVIDER NOTE: Information contained herein reflects review and annotations entered in association with encounter. Interpretation of such information and data should be left to medically-trained personnel. Information provided to patient can be located elsewhere in the medical record under "Patient Instructions". Document created using STT-dictation technology, any transcriptional errors that may result from process are unintentional.    Patient: Leslie Orr  Service Category: Procedure  Provider: Edward Jolly, MD  DOB: [birth year]  DOS: 11/12/2020  Location: [facility]  MRN: [id number]  Setting: Ambulatory - outpatient  Referring Provider: Edward Jolly, MD  Type: Established Patient  Specialty: Interventional Pain Management  PCP: Cyndia Diver, PA-C   Primary Reason for Visit: Interventional Pain Management Treatment. CC: right rib pain  Procedure:          Anesthesia, Analgesia, Anxiolysis:  Type: Diagnostic Posterior Intercostal  Nerve Block  #1  Region: Posterior  Thoracic Area Level: T9, T10, T11 ribs Laterality: Right-Sided  Type: Local Anesthesia with PO Valium for anxiety  Local Anesthetic: Lidocaine 1-2%  Position: Prone   Indications: 1. Intercostal neuralgia (right side)   2. Neuropathic pain   3. Chronic pain syndrome    Pain Score: Pre-procedure: 5 /10 Post-procedure: 3 /10   Pre-op H&P Assessment:  Leslie Orr is a 20 y.o. (year old), female patient, seen today for interventional treatment. She  has a past surgical history that includes Other surgical history (Nov. 2001) and Back surgery (04/22/14 - 04/27/14). Leslie Orr has a current medication list which includes the following prescription(s): amitriptyline, bupropion, clonidine, cyclobenzaprine, duloxetine, duloxetine, epipen 2-pak, esomeprazole, gabapentin, magnesium gluconate, methylphenidate, multivitamin with minerals, ondansetron, prednisone, riboflavin, sumatriptan, azelastine,  cetirizine, fluticasone, loperamide, montelukast, pantoprazole, promethazine, propranolol, and ranitidine.  Initial Vital Signs:  Pulse/HCG Rate: 95ECG Heart Rate: 73 Temp: 97.9 F (36.6 C) Resp: 18 BP: 115/75 SpO2: 99 %  BMI: Estimated body mass index is 28.32 kg/m as calculated from the following:   Height as of this encounter: 5\' 4"  (1.626 m).   Weight as of this encounter: 165 lb (74.8 kg).  Risk Assessment: Allergies: Reviewed. She is allergic to other, lactase, and penicillins.  Allergy Precautions: None required Coagulopathies: Reviewed. None identified.  Blood-thinner therapy: None at this time Active Infection(s): Reviewed. None identified. Leslie Orr is afebrile  Site Confirmation: Leslie Orr was asked to confirm the procedure and laterality before marking the site Procedure checklist: Completed Consent: Before the procedure and under the influence of no sedative(s), amnesic(s), or anxiolytics, the patient was informed of the treatment options, risks and possible complications. To fulfill our ethical and legal obligations, as recommended by the American Medical Association's Code of Ethics, I have informed the patient of my clinical impression; the nature and purpose of the treatment or procedure; the risks, benefits, and possible complications of the intervention; the alternatives, including doing nothing; the risk(s) and benefit(s) of the alternative treatment(s) or procedure(s); and the risk(s) and benefit(s) of doing nothing. The patient was provided information about the general risks and possible complications associated with the procedure. These may include, but are not limited to: failure to achieve desired goals, infection, bleeding, organ or nerve damage, allergic reactions, paralysis, and death. In addition, the patient was informed of those risks and complications associated to the procedure, such as failure to decrease pain; infection; bleeding; organ or nerve damage  with subsequent damage to sensory, motor, and/or autonomic systems, resulting in permanent pain, numbness, and/or weakness of one or several areas of the body; allergic reactions; (i.e.: anaphylactic  reaction); and/or death. Furthermore, the patient was informed of those risks and complications associated with the medications. These include, but are not limited to: allergic reactions (i.e.: anaphylactic or anaphylactoid reaction(s)); adrenal axis suppression; blood sugar elevation that in diabetics may result in ketoacidosis or comma; water retention that in patients with history of congestive heart failure may result in shortness of breath, pulmonary edema, and decompensation with resultant heart failure; weight gain; swelling or edema; medication-induced neural toxicity; particulate matter embolism and blood vessel occlusion with resultant organ, and/or nervous system infarction; and/or aseptic necrosis of one or more joints. Finally, the patient was informed that Medicine is not an exact science; therefore, there is also the possibility of unforeseen or unpredictable risks and/or possible complications that may result in a catastrophic outcome. The patient indicated having understood very clearly. We have given the patient no guarantees and we have made no promises. Enough time was given to the patient to ask questions, all of which were answered to the patient's satisfaction. Leslie Orr has indicated that she wanted to continue with the procedure. Attestation: I, the ordering provider, attest that I have discussed with the patient the benefits, risks, side-effects, alternatives, likelihood of achieving goals, and potential problems during recovery for the procedure that I have provided informed consent. Date  Time: 11/12/2020 11:26 AM  Pre-Procedure Preparation:  Monitoring: As per clinic protocol. Respiration, ETCO2, SpO2, BP, heart rate and rhythm monitor placed and checked for adequate function Safety  Precautions: Patient was assessed for positional comfort and pressure points before starting the procedure. Time-out: I initiated and conducted the "Time-out" before starting the procedure, as per protocol. The patient was asked to participate by confirming the accuracy of the "Time Out" information. Verification of the correct person, site, and procedure were performed and confirmed by me, the nursing staff, and the patient. "Time-out" conducted as per Joint Commission's Universal Protocol (UP.01.01.01). Time: 1157  Description of Procedure:          Target Area: The sub-costal neurovascular bundle area Approach: Sub-costal approach Area Prepped: Entire Posterior  Thoracic Region DuraPrep (Iodine Povacrylex [0.7% available iodine] and Isopropyl Alcohol, 74% w/w) Safety Precautions: Aspiration looking for blood return was conducted prior to all injections. At no point did we inject any substances, as a needle was being advanced. No attempts were made at seeking any paresthesias. Safe injection practices and needle disposal techniques used. Medications properly checked for expiration dates. SDV (single dose vial) medications used. Description of the Procedure: Protocol guidelines were followed. The patient was placed in position over the procedure table. The target area was identified and the area prepped in the usual manner. Skin & deeper tissues infiltrated with local anesthetic. After cleaning the skin with an antiseptic solution, 1-2 mL of dilute local anesthetic was infiltrated subcutaneously at the planned injection site. The fingers of the palpating hand were used to straddle the insertion site at the inferior border of the rib and fix the skin to avoid unwanted skin movement. Appropriate amount of time allowed to pass for local anesthetics to take effect. The procedure needles were then advanced to the target area at an angle of approximately 20 cephalad to the skin. Contact with the rib was made.  While maintaining the same angle of insertion, the needle was walked off the inferior border of the rib as the skin was allowed to return to its initial position. Then the needle was advanced no more than 3 mm below the inferior margin of the rib. Proper  needle placement was secured. Negative aspiration confirmed. Following negative aspiration for blood or air, 3-5 mL of local anesthetic was injected. The solution injected in intermittent fashion, asking for systemic symptoms every 0.5cc of injectate. The needle was then removed and the area cleansed, making sure to leave some of the prepping solution back to take advantage of its long term bactericidal properties. Vitals:   11/12/20 1159 11/12/20 1205 11/12/20 1210 11/12/20 1215  BP: 116/73 99/84 111/85 105/63  Pulse:      Resp: 16 16 16 18   Temp:      TempSrc:      SpO2: 100% 100% 100% 100%  Weight:      Height:        Start Time: 1157 hrs. End Time: 1210 hrs.   Materials:  Needle(s) Type: Regular needle Gauge: 25G Length: 3.5-in Medication(s): Please see orders for medications and dosing details. 9 cc solution made of 7 cc of 0.2% ropivacaine, 2 cc of Decadron 10 mg/cc.  3 cc injected at each level above after confirmation via contrast Imaging Guidance (Non-Spinal):          Type of Imaging Technique: Fluoroscopy Guidance (Non-Spinal) Indication(s): Assistance in needle guidance and placement for procedures requiring needle placement in or near specific anatomical locations not easily accessible without such assistance. Exposure Time: Please see nurses notes. Contrast: Before injecting any contrast, we confirmed that the patient did not have an allergy to iodine, shellfish, or radiological contrast. Once satisfactory needle placement was completed at the desired level, radiological contrast was injected. Contrast injected under live fluoroscopy. No contrast complications. See chart for type and volume of contrast used. Fluoroscopic  Guidance: I was personally present during the use of fluoroscopy. "Tunnel Vision Technique" used to obtain the best possible view of the target area. Parallax error corrected before commencing the procedure. "Direction-depth-direction" technique used to introduce the needle under continuous pulsed fluoroscopy. Once target was reached, antero-posterior, oblique, and lateral fluoroscopic projection used confirm needle placement in all planes. Images permanently stored in EMR. Interpretation: I personally interpreted the imaging intraoperatively. Adequate needle placement confirmed in multiple planes. Appropriate spread of contrast into desired area was observed. No evidence of afferent or efferent intravascular uptake. Permanent images saved into the patient's record.   Post-operative Assessment:  Post-procedure Vital Signs:  Pulse/HCG Rate: 9571 Temp: 97.9 F (36.6 C) Resp: 18 BP: 105/63 SpO2: 100 %  EBL: None  Complications: No immediate post-treatment complications observed by team, or reported by patient.  Note: The patient tolerated the entire procedure well. A repeat set of vitals were taken after the procedure and the patient was kept under observation following institutional policy, for this type of procedure. Post-procedural neurological assessment was performed, showing return to baseline, prior to discharge. The patient was provided with post-procedure discharge instructions, including a section on how to identify potential problems. Should any problems arise concerning this procedure, the patient was given instructions to immediately contact us, at any time, without hesitation. In any case, we plan to contact the patient by telephone for a follow-up status report regarding this interventional procedure.  Comments:  No additional relevant information.  Plan of Care  Orders:  Orders Placed This Encounter  Procedures  . DG PAIN CLINIC C-ARM 1-60 MIN NO REPORT    Intraoperative  interpretation by procedural physician at [facility].    Standing Status:   Standing    Number of Occurrences:   1    Order Specific Question:   Reason  for exam:    Answer:   Assistance in needle guidance and placement for procedures requiring needle placement in or near specific anatomical locations not easily accessible without such assistance.    Medications ordered for procedure: Meds ordered this encounter  Medications  . iohexol (OMNIPAQUE) 180 MG/ML injection 10 mL    Must be Myelogram-compatible. If not available, you may substitute with a water-soluble, non-ionic, hypoallergenic, myelogram-compatible radiological contrast medium.  Marland Kitchen lidocaine (XYLOCAINE) 2 % (with pres) injection 400 mg  . dexamethasone (DECADRON) injection 10 mg  . dexamethasone (DECADRON) injection 10 mg  . ropivacaine (PF) 2 mg/mL (0.2%) (NAROPIN) injection 9 mL  . diazepam (VALIUM) tablet 5 mg   Medications administered: We administered iohexol, lidocaine, dexamethasone, dexamethasone, ropivacaine (PF) 2 mg/mL (0.2%), and diazepam.  See the medical record for exact dosing, route, and time of administration.  Follow-up plan:   Return in about 4 weeks (around 12/10/2020) for Post Procedure Evaluation, virtual.      Right T9, T10, T11 intercostal nerve block 11/12/2020.   Recent Visits Date Type Provider Dept  10/22/20 Office Visit Edward Jolly, MD [facility]  Showing recent visits within past 90 days and meeting all other requirements Today's Visits Date Type Provider Dept  11/12/20 Procedure visit Edward Jolly, MD [facility]  Showing today's visits and meeting all other requirements Future Appointments Date Type Provider Dept  12/10/20 Appointment Edward Jolly, MD [facility]  Showing future appointments within next 90 days and meeting all other requirements  Disposition: Discharge home  Discharge (Date  Time): 11/12/2020; 1220 hrs.   Primary Care  Physician: Cyndia Diver, PA-C Location: [facility] Note by: Edward Jolly, MD Date: 11/12/2020; Time: 1:25 PM  Disclaimer:  Medicine is not an exact science. The only guarantee in medicine is that nothing is guaranteed. It is important to note that the decision to proceed with this intervention was based on the information collected from the patient. The Data and conclusions were drawn from the patient's questionnaire, the interview, and the physical examination. Because the information was provided in large part by the patient, it cannot be guaranteed that it has not been purposely or unconsciously manipulated. Every effort has been made to obtain as much relevant data as possible for this evaluation. It is important to note that the conclusions that lead to this procedure are derived in large part from the available data. Always take into account that the treatment will also be dependent on availability of resources and existing treatment guidelines, considered by other Pain Management Practitioners as being common knowledge and practice, at the time of the intervention. For Medico-Legal purposes, it is also important to point out that variation in procedural techniques and pharmacological choices are the acceptable norm. The indications, contraindications, technique, and results of the above procedure should only be interpreted and judged by a Board-Certified Interventional Pain Specialist with extensive familiarity and expertise in the same exact procedure and technique.

## 2020-11-12 NOTE — Patient Instructions (Signed)
GENERAL RISKS AND COMPLICATIONS  What are the risk, side effects and possible complications? Generally speaking, most procedures are safe.  However, with any procedure there are risks, side effects, and the possibility of complications.  The risks and complications are dependent upon the sites that are lesioned, or the type of nerve block to be performed.  The closer the procedure is to the spine, the more serious the risks are.  Great care is taken when placing the radio frequency needles, block needles or lesioning probes, but sometimes complications can occur. 1. Infection: Any time there is an injection through the skin, there is a risk of infection.  This is why sterile conditions are used for these blocks.  There are four possible types of infection. 1. Localized skin infection. 2. Central Nervous System Infection-This can be in the form of Meningitis, which can be deadly. 3. Epidural Infections-This can be in the form of an epidural abscess, which can cause pressure inside of the spine, causing compression of the spinal cord with subsequent paralysis. This would require an emergency surgery to decompress, and there are no guarantees that the patient would recover from the paralysis. 4. Discitis-This is an infection of the intervertebral discs.  It occurs in about 1% of discography procedures.  It is difficult to treat and it may lead to surgery.        2. Pain: the needles have to go through skin and soft tissues, will cause soreness.       3. Damage to internal structures:  The nerves to be lesioned may be near blood vessels or    other nerves which can be potentially damaged.       4. Bleeding: Bleeding is more common if the patient is taking blood thinners such as  aspirin, Coumadin, Ticiid, Plavix, etc., or if he/she have some genetic predisposition  such as hemophilia. Bleeding into the spinal canal can cause compression of the spinal  cord with subsequent paralysis.  This would require an  emergency surgery to  decompress and there are no guarantees that the patient would recover from the  paralysis.       5. Pneumothorax:  Puncturing of a lung is a possibility, every time a needle is introduced in  the area of the chest or upper back.  Pneumothorax refers to free air around the  collapsed lung(s), inside of the thoracic cavity (chest cavity).  Another two possible  complications related to a similar event would include: Hemothorax and Chylothorax.   These are variations of the Pneumothorax, where instead of air around the collapsed  lung(s), you may have blood or chyle, respectively.       6. Spinal headaches: They may occur with any procedures in the area of the spine.       7. Persistent CSF (Cerebro-Spinal Fluid) leakage: This is a rare problem, but may occur  with prolonged intrathecal or epidural catheters either due to the formation of a fistulous  track or a dural tear.       8. Nerve damage: By working so close to the spinal cord, there is always a possibility of  nerve damage, which could be as serious as a permanent spinal cord injury with  paralysis.       9. Death:  Although rare, severe deadly allergic reactions known as "Anaphylactic  reaction" can occur to any of the medications used.      10. Worsening of the symptoms:  We can always make thing worse.    What are the chances of something like this happening? Chances of any of this occuring are extremely low.  By statistics, you have more of a chance of getting killed in a motor vehicle accident: while driving to the hospital than any of the above occurring .  Nevertheless, you should be aware that they are possibilities.  In general, it is similar to taking a shower.  Everybody knows that you can slip, hit your head and get killed.  Does that mean that you should not shower again?  Nevertheless always keep in mind that statistics do not mean anything if you happen to be on the wrong side of them.  Even if a procedure has a 1  (one) in a 1,000,000 (million) chance of going wrong, it you happen to be that one..Also, keep in mind that by statistics, you have more of a chance of having something go wrong when taking medications.  Who should not have this procedure? If you are on a blood thinning medication (e.g. Coumadin, Plavix, see list of "Blood Thinners"), or if you have an active infection going on, you should not have the procedure.  If you are taking any blood thinners, please inform your physician.  How should I prepare for this procedure?  Do not eat or drink anything at least six hours prior to the procedure.  Bring a driver with you .  It cannot be a taxi.  Come accompanied by an adult that can drive you back, and that is strong enough to help you if your legs get weak or numb from the local anesthetic.  Take all of your medicines the morning of the procedure with just enough water to swallow them.  If you have diabetes, make sure that you are scheduled to have your procedure done first thing in the morning, whenever possible.  If you have diabetes, take only half of your insulin dose and notify our nurse that you have done so as soon as you arrive at the clinic.  If you are diabetic, but only take blood sugar pills (oral hypoglycemic), then do not take them on the morning of your procedure.  You may take them after you have had the procedure.  Do not take aspirin or any aspirin-containing medications, at least eleven (11) days prior to the procedure.  They may prolong bleeding.  Wear loose fitting clothing that may be easy to take off and that you would not mind if it got stained with Betadine or blood.  Do not wear any jewelry or perfume  Remove any nail coloring.  It will interfere with some of our monitoring equipment.  NOTE: Remember that this is not meant to be interpreted as a complete list of all possible complications.  Unforeseen problems may occur.  BLOOD THINNERS The following drugs  contain aspirin or other products, which can cause increased bleeding during surgery and should not be taken for 2 weeks prior to and 1 week after surgery.  If you should need take something for relief of minor pain, you may take acetaminophen which is found in Tylenol,m Datril, Anacin-3 and Panadol. It is not blood thinner. The products listed below are.  Do not take any of the products listed below in addition to any listed on your instruction sheet.  A.P.C or A.P.C with Codeine Codeine Phosphate Capsules #3 Ibuprofen Ridaura  ABC compound Congesprin Imuran rimadil  Advil Cope Indocin Robaxisal  Alka-Seltzer Effervescent Pain Reliever and Antacid Coricidin or Coricidin-D  Indomethacin Rufen    Alka-Seltzer plus Cold Medicine Cosprin Ketoprofen S-A-C Tablets  Anacin Analgesic Tablets or Capsules Coumadin Korlgesic Salflex  Anacin Extra Strength Analgesic tablets or capsules CP-2 Tablets Lanoril Salicylate  Anaprox Cuprimine Capsules Levenox Salocol  Anexsia-D Dalteparin Magan Salsalate  Anodynos Darvon compound Magnesium Salicylate Sine-off  Ansaid Dasin Capsules Magsal Sodium Salicylate  Anturane Depen Capsules Marnal Soma  APF Arthritis pain formula Dewitt's Pills Measurin Stanback  Argesic Dia-Gesic Meclofenamic Sulfinpyrazone  Arthritis Bayer Timed Release Aspirin Diclofenac Meclomen Sulindac  Arthritis pain formula Anacin Dicumarol Medipren Supac  Analgesic (Safety coated) Arthralgen Diffunasal Mefanamic Suprofen  Arthritis Strength Bufferin Dihydrocodeine Mepro Compound Suprol  Arthropan liquid Dopirydamole Methcarbomol with Aspirin Synalgos  ASA tablets/Enseals Disalcid Micrainin Tagament  Ascriptin Doan's Midol Talwin  Ascriptin A/D Dolene Mobidin Tanderil  Ascriptin Extra Strength Dolobid Moblgesic Ticlid  Ascriptin with Codeine Doloprin or Doloprin with Codeine Momentum Tolectin  Asperbuf Duoprin Mono-gesic Trendar  Aspergum Duradyne Motrin or Motrin IB Triminicin  Aspirin  plain, buffered or enteric coated Durasal Myochrisine Trigesic  Aspirin Suppositories Easprin Nalfon Trillsate  Aspirin with Codeine Ecotrin Regular or Extra Strength Naprosyn Uracel  Atromid-S Efficin Naproxen Ursinus  Auranofin Capsules Elmiron Neocylate Vanquish  Axotal Emagrin Norgesic Verin  Azathioprine Empirin or Empirin with Codeine Normiflo Vitamin E  Azolid Emprazil Nuprin Voltaren  Bayer Aspirin plain, buffered or children's or timed BC Tablets or powders Encaprin Orgaran Warfarin Sodium  Buff-a-Comp Enoxaparin Orudis Zorpin  Buff-a-Comp with Codeine Equegesic Os-Cal-Gesic   Buffaprin Excedrin plain, buffered or Extra Strength Oxalid   Bufferin Arthritis Strength Feldene Oxphenbutazone   Bufferin plain or Extra Strength Feldene Capsules Oxycodone with Aspirin   Bufferin with Codeine Fenoprofen Fenoprofen Pabalate or Pabalate-SF   Buffets II Flogesic Panagesic   Buffinol plain or Extra Strength Florinal or Florinal with Codeine Panwarfarin   Buf-Tabs Flurbiprofen Penicillamine   Butalbital Compound Four-way cold tablets Penicillin   Butazolidin Fragmin Pepto-Bismol   Carbenicillin Geminisyn Percodan   Carna Arthritis Reliever Geopen Persantine   Carprofen Gold's salt Persistin   Chloramphenicol Goody's Phenylbutazone   Chloromycetin Haltrain Piroxlcam   Clmetidine heparin Plaquenil   Cllnoril Hyco-pap Ponstel   Clofibrate Hydroxy chloroquine Propoxyphen         Before stopping any of these medications, be sure to consult the physician who ordered them.  Some, such as Coumadin (Warfarin) are ordered to prevent or treat serious conditions such as "deep thrombosis", "pumonary embolisms", and other heart problems.  The amount of time that you may need off of the medication may also vary with the medication and the reason for which you were taking it.  If you are taking any of these medications, please make sure you notify your pain physician before you undergo any  procedures.         Pain Management Discharge Instructions  General Discharge Instructions :  If you need to reach your doctor call: Monday-Friday 8:00 am - 4:00 pm at [phone] or toll free [phone].  After clinic hours [phone] to have operator reach doctor.  Bring all of your medication bottles to all your appointments in the pain clinic.  To cancel or reschedule your appointment with Pain Management please remember to call 24 hours in advance to avoid a fee.  Refer to the educational materials which you have been given on: General Risks, I had my Procedure. Discharge Instructions, Post Sedation.  Post Procedure Instructions:  The drugs you were given will stay in your system until tomorrow, so for the next 24 hours you should   not drive, make any legal decisions or drink any alcoholic beverages.  You may eat anything you prefer, but it is better to start with liquids then soups and crackers, and gradually work up to solid foods.  Please notify your doctor immediately if you have any unusual bleeding, trouble breathing or pain that is not related to your normal pain.  Depending on the type of procedure that was done, some parts of your body may feel week and/or numb.  This usually clears up by tonight or the next day.  Walk with the use of an assistive device or accompanied by an adult for the 24 hours.  You may use ice on the affected area for the first 24 hours.  Put ice in a Ziploc bag and cover with a towel and place against area 15 minutes on 15 minutes off.  You may switch to heat after 24 hours.Intercostal Nerve Block Patient Information  Description: The twelve intercostal nerves arise from the first thru twelfth thoracic nerve roots.  The nerve begins at the spine and wraps around the body, lying in a groove underneath each rib.  Each intercostal nerve innervates a specific strip of skin and body walk of the abdomen and chest.  Therefore, injuries of the  chest wall or abdominal wall result in pain that is transmitted back to the brian via the intercostal nerves.  Examples of such injuries include rib fractures and incisions for lung and gall bladder surgery.  Occasionally, pain may persist long after an injury or surgical incision secondary to inflammation and irritation of the intercostal nerve.  The longstanding pain is known as intercostal neuralgia.  An intercostal nerve block is preformed to eliminate pain either temporarily or permanently.  A small needle is placed below the rib and local anesthetic (like Novocaine) and possibly steroid is injected.  Usually 2-4 intercostal nerves are blocked at a time depending on the problem.  The patient will experience a slight "pin-prick" sensation for each injection.  Shortly thereafter, the strip of skin that is innervated by the blocked intercostal nerve will feel numb.  Persistent pain that is only temporarily relieved with local anesthetic may require a more permanent block. This procedure is called Cryoneurolysis and entails placing a small probe beneath the rib to freeze the nerve.  Conditions that may be treated by intercostal nerve blocks:   Rib fractures  Longstanding pain from surgery of the chest or abdomen (intercostal neuralgia)  Pain from chest tubes  Pain from trauma to the chest  Preparation for the injections:  1. Do not eat any solid food or dairy products within 8 hours of your appointment. 2. You may drink clear liquids up to 3 hours before appointment.  Clear liquids include water, black coffee, juice or soda.  No milk or cream please. 3. You may take your regular medication, including pain medications, with a sip of water before your appointment.  Diabetics should hold regular insulin (if take separately) and take 1/2 normal NPH dose the morning of the procedure.   Carry some sugar containing items with you to your appointment. 4. A driver must accompany you and be prepared to  drive you home after your procedure. 5. Bring all your current medications with you. 6. An IV may be inserted and sedation may be given at the discretion of the physician. 7. A blood pressure cuff, EKG and other monitors will often be applied during the procedure.  Some patients may need to have extra oxygen administered  for a short period. 8. You will be asked to provide medical information, including your allergies, prior to the procedure.  We must know immediately if you are taking blood thinners (like Coumadin/Warfarin) or if you are allergic to IV iodine contrast (dye). We must know if you could possible be pregnant.  Possible side-effects:   Bleeding from needle site  Infection (rare)  Nerve injury (rare)  Numbness & tingling of skin  Collapsed lung requiring chest tube (rare)  Local anesthetic toxicity (rare)  Light-headedness (temporary)  Pain at injection site (several days)  Decreased blood pressure (temporary)  Shortness of breath  Jittery/shaking sensation (temporary)  Call if you experience:   Difficulty breathing or hives (go directly to the emergency room)  Redness, inflammation or drainage at the injection site  Severe pain at the site of the injection  Any new symptoms which are concerning   Please note:  Your pain may subside immediately but may return several hours after the injection.  Often, more than one injection is required to reduce the pain. Also, if several temporary blocks with local anesthetic are ineffective, a more permanent block with cryolysis may be necessary.  This will be discussed with you should this be the case.  If you have any questions, please call [phone] [facility]     

## 2020-11-13 ENCOUNTER — Telehealth: Payer: Self-pay

## 2020-11-13 NOTE — Telephone Encounter (Signed)
Post procedure follow up.  Patient states she was doing good.

## 2020-12-09 ENCOUNTER — Telehealth: Payer: Self-pay

## 2020-12-09 ENCOUNTER — Encounter: Payer: Self-pay | Admitting: Student in an Organized Health Care Education/Training Program

## 2020-12-09 ENCOUNTER — Ambulatory Visit: Payer: Medicaid Other

## 2020-12-09 NOTE — Telephone Encounter (Signed)
LM for patient to call office for pre virtual appointment questions.  

## 2020-12-10 ENCOUNTER — Telehealth (HOSPITAL_BASED_OUTPATIENT_CLINIC_OR_DEPARTMENT_OTHER): Payer: Medicaid Other | Admitting: Student in an Organized Health Care Education/Training Program

## 2020-12-10 DIAGNOSIS — G588 Other specified mononeuropathies: Secondary | ICD-10-CM

## 2020-12-15 ENCOUNTER — Ambulatory Visit: Payer: Medicaid Other

## 2020-12-15 ENCOUNTER — Ambulatory Visit (LOCAL_COMMUNITY_HEALTH_CENTER): Payer: Medicaid Other | Admitting: Family Medicine

## 2020-12-15 ENCOUNTER — Other Ambulatory Visit: Payer: Self-pay

## 2020-12-15 VITALS — BP 112/80 | Ht 63.0 in | Wt 166.0 lb

## 2020-12-15 DIAGNOSIS — Z3009 Encounter for other general counseling and advice on contraception: Secondary | ICD-10-CM

## 2020-12-15 DIAGNOSIS — Z30013 Encounter for initial prescription of injectable contraceptive: Secondary | ICD-10-CM | POA: Diagnosis not present

## 2020-12-15 MED ORDER — MEDROXYPROGESTERONE ACETATE 150 MG/ML IM SUSP
150.0000 mg | Freq: Once | INTRAMUSCULAR | Status: AC
  Administered 2020-12-15: 150 mg via INTRAMUSCULAR

## 2020-12-15 NOTE — Progress Notes (Signed)
Depo given per Newton VO; tolerated well.  Patient instructed to RTC 03/02/21 for PE and Depo.Richmond Campbell, RN

## 2020-12-16 NOTE — Progress Notes (Signed)
Attestation of Attending Supervision of clinical support staff: I agree with the care provided to this patient and was available for any consultation.  I have reviewed the RN's note and chart. I was available for consult and to see the patient if needed.   Kimberly Niles Newton, MD, MPH, ABFM Attending Physician Faculty [facility]- [facility]  

## 2020-12-22 ENCOUNTER — Other Ambulatory Visit (INDEPENDENT_AMBULATORY_CARE_PROVIDER_SITE_OTHER): Payer: Self-pay | Admitting: Neurology

## 2020-12-29 ENCOUNTER — Ambulatory Visit (INDEPENDENT_AMBULATORY_CARE_PROVIDER_SITE_OTHER): Payer: Medicaid Other | Admitting: Neurology

## 2020-12-30 ENCOUNTER — Encounter (INDEPENDENT_AMBULATORY_CARE_PROVIDER_SITE_OTHER): Payer: Self-pay

## 2021-01-08 ENCOUNTER — Other Ambulatory Visit: Payer: Self-pay

## 2021-01-08 ENCOUNTER — Encounter: Payer: Self-pay | Admitting: Student in an Organized Health Care Education/Training Program

## 2021-01-08 ENCOUNTER — Ambulatory Visit
Payer: Medicaid Other | Attending: Student in an Organized Health Care Education/Training Program | Admitting: Student in an Organized Health Care Education/Training Program

## 2021-01-08 DIAGNOSIS — M792 Neuralgia and neuritis, unspecified: Secondary | ICD-10-CM

## 2021-01-08 DIAGNOSIS — G588 Other specified mononeuropathies: Secondary | ICD-10-CM

## 2021-01-08 DIAGNOSIS — M41114 Juvenile idiopathic scoliosis, thoracic region: Secondary | ICD-10-CM

## 2021-01-08 DIAGNOSIS — G894 Chronic pain syndrome: Secondary | ICD-10-CM | POA: Diagnosis not present

## 2021-01-08 DIAGNOSIS — M7918 Myalgia, other site: Secondary | ICD-10-CM

## 2021-01-08 DIAGNOSIS — Z981 Arthrodesis status: Secondary | ICD-10-CM | POA: Diagnosis not present

## 2021-01-08 NOTE — Progress Notes (Signed)
Patient: Leslie Orr  Service Category: E/M  Provider: Gillis Santa, MD  DOB: 26-Mar-2001  DOS: 01/08/2021  Location: Office  MRN: [id number]  Setting: Ambulatory outpatient  Referring Provider: Dionicia Abler, PA-C  Type: Established Patient  Specialty: Interventional Pain Management  PCP: Dionicia Abler, PA-C  Location: Home  Delivery: TeleHealth     Virtual Encounter - Pain Management PROVIDER NOTE: Information contained herein reflects review and annotations entered in association with encounter. Interpretation of such information and data should be left to medically-trained personnel. Information provided to patient can be located elsewhere in the medical record under "Patient Instructions". Document created using STT-dictation technology, any transcriptional errors that may result from process are unintentional.    Contact & Pharmacy Preferred: [phone] Home: [phone] (home) Mobile: [phone] (mobile) E-mail: [email or web address]  [id number] [address], [place] [address] Phone: [phone] Fax: [phone]   Pre-screening  Leslie Orr offered "in-person" vs "virtual" encounter. She indicated preferring virtual for this encounter.   Reason COVID-19*  Social distancing based on CDC and AMA recommendations.   I contacted Leslie Orr on 01/08/2021 via video conference.      I clearly identified myself as Gillis Santa, MD. I verified that I was speaking with the correct person using two identifiers (Name: Leslie Orr, and date of birth: 12-26-2000).  Consent I sought verbal advanced consent from Leslie Orr for virtual visit interactions. I informed Leslie Orr of possible security and privacy concerns, risks, and limitations associated with providing "not-in-person" medical evaluation and management services. I also informed Leslie Orr of the availability of "in-person" appointments. Finally,  I informed her that there would be a charge for the virtual visit and that she could be  personally, fully or partially, financially responsible for it. Leslie Orr expressed understanding and agreed to proceed.   Historic Elements   Leslie Orr is a 20 y.o. year old, female patient evaluated today after our last contact on 11/12/2020. Leslie Orr  has a past medical history of Acid reflux, ADHD, Depression, Hypertension, Migraines, Nausea, Neuralgia, and Seasonal allergies. She also  has a past surgical history that includes Other surgical history (Nov. 2001) and Back surgery (04/22/14 - 04/27/14). Leslie Orr has a current medication list which includes the following prescription(s): amitriptyline, bupropion, clonidine, cyclobenzaprine, dicyclomine, duloxetine, duloxetine, epipen 2-pak, esomeprazole, fluticasone, gabapentin, hydroxyzine, magnesium gluconate, methylphenidate, multivitamin with minerals, ondansetron, ondansetron, riboflavin, sumatriptan, azelastine, cetirizine, loperamide, montelukast, pantoprazole, prednisone, promethazine, propranolol, and ranitidine. She  reports that she is a non-smoker but has been exposed to tobacco smoke. She has never used smokeless tobacco. She reports that she does not drink alcohol and does not use drugs. Leslie Orr is allergic to other, lactase, and penicillins.   HPI  Today, she is being contacted for a post-procedure assessment.   Post-Procedure Evaluation  Procedure (11/12/2020):   Type: Diagnostic Posterior Intercostal  Nerve Block  #1  Region: Posterior  Thoracic Area Level: T9, T10, T11 ribs Laterality: Right-Sided Sedation: Please see nurses note.  Effectiveness during initial hour after procedure(Ultra-Short Term Relief): 100 % .  Local anesthetic used: Long-acting (4-6 hours) Effectiveness: Defined as any analgesic benefit obtained secondary to the administration of local anesthetics. This carries significant diagnostic value as to the  etiological location, or anatomical origin, of the pain. Duration of benefit is expected to coincide with the duration of the local anesthetic used.  Effectiveness during initial 4-6 hours after procedure(Short-Term Relief): 100 %  Long-term benefit: Defined as any relief past the pharmacologic duration of the local anesthetics.  Effectiveness past the initial 6 hours after procedure(Long-Term Relief): 100 % (good relief from nerve pain x 1 week and then the pain came back all at once.)   Current benefits: Defined as benefit that persist at this time.   Analgesia:  Back to baseline Function: Back to baseline  Laboratory Chemistry Profile   Renal Lab Results  Component Value Date   BUN 12 11/06/2020   CREATININE 0.73 09/98/3382   BCR NOT APPLICABLE 50/53/9767   GFRAA >60 03/03/2020   GFRNONAA >60 11/06/2020     Hepatic Lab Results  Component Value Date   AST 29 11/06/2020   ALT 38 11/06/2020   ALBUMIN 5.0 11/06/2020   ALKPHOS 74 11/06/2020   LIPASE 22 11/06/2020     Electrolytes Lab Results  Component Value Date   NA 138 11/06/2020   K 4.5 11/06/2020   CL 101 11/06/2020   CALCIUM 10.3 11/06/2020   MG 2.0 08/03/2017     Bone Lab Results  Component Value Date   VD25OH 27 (L) 08/03/2017     Inflammation (CRP: Acute Phase) (ESR: Chronic Phase) Lab Results  Component Value Date   ESRSEDRATE 4 02/27/2015       Note: Above Lab results reviewed.   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 Diagnoses of Neuropathic pain, Chronic pain syndrome, S/P lumbar spinal fusion, Juvenile idiopathic scoliosis of thoracic region, and Myofascial pain syndrome of lumbar spine were also pertinent to this visit.  Plan of Care  Leslie Orr has a current medication list which includes the following long-term medication(s): amitriptyline, bupropion, clonidine, dicyclomine, duloxetine, duloxetine, esomeprazole, gabapentin,  sumatriptan, azelastine, cetirizine, montelukast, pantoprazole, promethazine, and propranolol.   Good short-term benefit after right intercostal nerve block.  Discussed repeating in injecting additional volume along each intercostal nerve.  If that does not result in increased duration of pain relief, we discussed pulsed radiofrequency ablation of the right T9, T10, T11 intercostal nerves.  Risks and benefits reviewed.  Can discuss further depending on how patient responds to right T9, T10, T11 intercostal nerve block #2.  For her low back pain, the patient has had trigger point injections in her lumbar spine.  She would like to repeat these.  We will plan on doing these the same day as her intercostal nerve block.  Orders:  Orders Placed This Encounter  Procedures  . INTERCOSTAL NERVE BLOCK    Standing Status:   Future    Standing Expiration Date:   07/11/2021    Scheduling Instructions:     Side: RIGHT T9, T10, T11     Sedation: PO Valium     Timeframe: ASAA    Order Specific Question:   Where will this procedure be performed?    Answer:   [facility]  . TRIGGER POINT INJECTION    Standing Status:   Future    Standing Expiration Date:   04/10/2021    Scheduling Instructions:     Low back myofascial pain    Order Specific Question:   Where will this procedure be performed?    Answer:   [facility]   Follow-up plan:   Return in about 1 week (around 01/15/2021) for R T9-11 ICNB #2 + Lumbar TPI, PO Valium.     Right T9, T10, T11 intercostal nerve block 11/12/2020.    Recent Visits Date Type Provider Dept  11/12/20  Procedure visit Gillis Santa, MD [facility]  10/22/20 Office Visit Gillis Santa, MD [facility]  Showing recent visits within past 90 days and meeting all other requirements Today's Visits Date Type Provider Dept  01/08/21 Telemedicine Gillis Santa, MD [facility]  Showing today's visits and meeting all other  requirements Future Appointments No visits were found meeting these conditions. Showing future appointments within next 90 days and meeting all other requirements  I discussed the assessment and treatment plan with the patient. The patient was provided an opportunity to ask questions and all were answered. The patient agreed with the plan and demonstrated an understanding of the instructions.  Patient advised to call back or seek an in-person evaluation if the symptoms or condition worsens.  Duration of encounter:69minutes.  Note by: Gillis Santa, MD Date: 01/08/2021; Time: 12:37 PM

## 2021-01-19 NOTE — Progress Notes (Signed)
Unable to contact

## 2021-01-21 ENCOUNTER — Other Ambulatory Visit (INDEPENDENT_AMBULATORY_CARE_PROVIDER_SITE_OTHER): Payer: Self-pay | Admitting: Neurology

## 2021-01-21 ENCOUNTER — Ambulatory Visit (HOSPITAL_BASED_OUTPATIENT_CLINIC_OR_DEPARTMENT_OTHER): Payer: Medicaid Other | Admitting: Student in an Organized Health Care Education/Training Program

## 2021-01-21 ENCOUNTER — Ambulatory Visit
Admission: RE | Admit: 2021-01-21 | Discharge: 2021-01-21 | Disposition: A | Discharge status: Home / Self Care | Payer: Medicaid Other | Source: Ambulatory Visit | Attending: Student in an Organized Health Care Education/Training Program | Admitting: Student in an Organized Health Care Education/Training Program

## 2021-01-21 ENCOUNTER — Other Ambulatory Visit: Payer: Self-pay

## 2021-01-21 ENCOUNTER — Encounter: Payer: Self-pay | Admitting: Student in an Organized Health Care Education/Training Program

## 2021-01-21 VITALS — BP 124/87 | HR 108 | Temp 97.3°F | Resp 16 | Ht 64.0 in | Wt 165.0 lb

## 2021-01-21 DIAGNOSIS — G894 Chronic pain syndrome: Secondary | ICD-10-CM

## 2021-01-21 DIAGNOSIS — M792 Neuralgia and neuritis, unspecified: Secondary | ICD-10-CM | POA: Diagnosis present

## 2021-01-21 DIAGNOSIS — G588 Other specified mononeuropathies: Secondary | ICD-10-CM

## 2021-01-21 MED ORDER — DIAZEPAM 5 MG PO TABS
ORAL_TABLET | ORAL | Status: AC
  Filled 2021-01-21: qty 1

## 2021-01-21 MED ORDER — ROPIVACAINE HCL 2 MG/ML IJ SOLN
9.0000 mL | Freq: Once | INTRAMUSCULAR | Status: AC
  Administered 2021-01-21: 9 mL via PERINEURAL

## 2021-01-21 MED ORDER — ROPIVACAINE HCL 2 MG/ML IJ SOLN
INTRAMUSCULAR | Status: AC
  Filled 2021-01-21: qty 10

## 2021-01-21 MED ORDER — LIDOCAINE HCL 2 % IJ SOLN
20.0000 mL | Freq: Once | INTRAMUSCULAR | Status: AC
  Administered 2021-01-21: 400 mg

## 2021-01-21 MED ORDER — DEXAMETHASONE SODIUM PHOSPHATE 10 MG/ML IJ SOLN
10.0000 mg | Freq: Once | INTRAMUSCULAR | Status: AC
  Administered 2021-01-21: 10 mg

## 2021-01-21 MED ORDER — LIDOCAINE HCL 2 % IJ SOLN
INTRAMUSCULAR | Status: AC
  Filled 2021-01-21: qty 20

## 2021-01-21 MED ORDER — DIAZEPAM 2 MG PO TABS
2.0000 mg | ORAL_TABLET | Freq: Once | ORAL | Status: AC
  Administered 2021-01-21: 2 mg via ORAL

## 2021-01-21 MED ORDER — DIAZEPAM 5 MG PO TABS
5.0000 mg | ORAL_TABLET | Freq: Once | ORAL | Status: AC
  Administered 2021-01-21: 5 mg via ORAL

## 2021-01-21 MED ORDER — IOHEXOL 180 MG/ML  SOLN
10.0000 mL | Freq: Once | INTRAMUSCULAR | Status: AC
  Administered 2021-01-21: 10 mL via INTRA_ARTICULAR

## 2021-01-21 MED ORDER — DEXAMETHASONE SODIUM PHOSPHATE 10 MG/ML IJ SOLN
INTRAMUSCULAR | Status: AC
  Filled 2021-01-21: qty 2

## 2021-01-21 NOTE — Patient Instructions (Signed)
GENERAL RISKS AND COMPLICATIONS  What are the risk, side effects and possible complications? Generally speaking, most procedures are safe.  However, with any procedure there are risks, side effects, and the possibility of complications.  The risks and complications are dependent upon the sites that are lesioned, or the type of nerve block to be performed.  The closer the procedure is to the spine, the more serious the risks are.  Great care is taken when placing the radio frequency needles, block needles or lesioning probes, but sometimes complications can occur. 1. Infection: Any time there is an injection through the skin, there is a risk of infection.  This is why sterile conditions are used for these blocks.  There are four possible types of infection. 1. Localized skin infection. 2. Central Nervous System Infection-This can be in the form of Meningitis, which can be deadly. 3. Epidural Infections-This can be in the form of an epidural abscess, which can cause pressure inside of the spine, causing compression of the spinal cord with subsequent paralysis. This would require an emergency surgery to decompress, and there are no guarantees that the patient would recover from the paralysis. 4. Discitis-This is an infection of the intervertebral discs.  It occurs in about 1% of discography procedures.  It is difficult to treat and it may lead to surgery.        2. Pain: the needles have to go through skin and soft tissues, will cause soreness.       3. Damage to internal structures:  The nerves to be lesioned may be near blood vessels or    other nerves which can be potentially damaged.       4. Bleeding: Bleeding is more common if the patient is taking blood thinners such as  aspirin, Coumadin, Ticiid, Plavix, etc., or if he/she have some genetic predisposition  such as hemophilia. Bleeding into the spinal canal can cause compression of the spinal  cord with subsequent paralysis.  This would require an  emergency surgery to  decompress and there are no guarantees that the patient would recover from the  paralysis.       5. Pneumothorax:  Puncturing of a lung is a possibility, every time a needle is introduced in  the area of the chest or upper back.  Pneumothorax refers to free air around the  collapsed lung(s), inside of the thoracic cavity (chest cavity).  Another two possible  complications related to a similar event would include: Hemothorax and Chylothorax.   These are variations of the Pneumothorax, where instead of air around the collapsed  lung(s), you may have blood or chyle, respectively.       6. Spinal headaches: They may occur with any procedures in the area of the spine.       7. Persistent CSF (Cerebro-Spinal Fluid) leakage: This is a rare problem, but may occur  with prolonged intrathecal or epidural catheters either due to the formation of a fistulous  track or a dural tear.       8. Nerve damage: By working so close to the spinal cord, there is always a possibility of  nerve damage, which could be as serious as a permanent spinal cord injury with  paralysis.       9. Death:  Although rare, severe deadly allergic reactions known as "Anaphylactic  reaction" can occur to any of the medications used.      10. Worsening of the symptoms:  We can always make thing worse.    What are the chances of something like this happening? Chances of any of this occuring are extremely low.  By statistics, you have more of a chance of getting killed in a motor vehicle accident: while driving to the hospital than any of the above occurring .  Nevertheless, you should be aware that they are possibilities.  In general, it is similar to taking a shower.  Everybody knows that you can slip, hit your head and get killed.  Does that mean that you should not shower again?  Nevertheless always keep in mind that statistics do not mean anything if you happen to be on the wrong side of them.  Even if a procedure has a 1  (one) in a 1,000,000 (million) chance of going wrong, it you happen to be that one..Also, keep in mind that by statistics, you have more of a chance of having something go wrong when taking medications.  Who should not have this procedure? If you are on a blood thinning medication (e.g. Coumadin, Plavix, see list of "Blood Thinners"), or if you have an active infection going on, you should not have the procedure.  If you are taking any blood thinners, please inform your physician.  How should I prepare for this procedure?  Do not eat or drink anything at least six hours prior to the procedure.  Bring a driver with you .  It cannot be a taxi.  Come accompanied by an adult that can drive you back, and that is strong enough to help you if your legs get weak or numb from the local anesthetic.  Take all of your medicines the morning of the procedure with just enough water to swallow them.  If you have diabetes, make sure that you are scheduled to have your procedure done first thing in the morning, whenever possible.  If you have diabetes, take only half of your insulin dose and notify our nurse that you have done so as soon as you arrive at the clinic.  If you are diabetic, but only take blood sugar pills (oral hypoglycemic), then do not take them on the morning of your procedure.  You may take them after you have had the procedure.  Do not take aspirin or any aspirin-containing medications, at least eleven (11) days prior to the procedure.  They may prolong bleeding.  Wear loose fitting clothing that may be easy to take off and that you would not mind if it got stained with Betadine or blood.  Do not wear any jewelry or perfume  Remove any nail coloring.  It will interfere with some of our monitoring equipment.  NOTE: Remember that this is not meant to be interpreted as a complete list of all possible complications.  Unforeseen problems may occur.  BLOOD THINNERS The following drugs  contain aspirin or other products, which can cause increased bleeding during surgery and should not be taken for 2 weeks prior to and 1 week after surgery.  If you should need take something for relief of minor pain, you may take acetaminophen which is found in Tylenol,m Datril, Anacin-3 and Panadol. It is not blood thinner. The products listed below are.  Do not take any of the products listed below in addition to any listed on your instruction sheet.  A.P.C or A.P.C with Codeine Codeine Phosphate Capsules #3 Ibuprofen Ridaura  ABC compound Congesprin Imuran rimadil  Advil Cope Indocin Robaxisal  Alka-Seltzer Effervescent Pain Reliever and Antacid Coricidin or Coricidin-D  Indomethacin Rufen    Alka-Seltzer plus Cold Medicine Cosprin Ketoprofen S-A-C Tablets  Anacin Analgesic Tablets or Capsules Coumadin Korlgesic Salflex  Anacin Extra Strength Analgesic tablets or capsules CP-2 Tablets Lanoril Salicylate  Anaprox Cuprimine Capsules Levenox Salocol  Anexsia-D Dalteparin Magan Salsalate  Anodynos Darvon compound Magnesium Salicylate Sine-off  Ansaid Dasin Capsules Magsal Sodium Salicylate  Anturane Depen Capsules Marnal Soma  APF Arthritis pain formula Dewitt's Pills Measurin Stanback  Argesic Dia-Gesic Meclofenamic Sulfinpyrazone  Arthritis Bayer Timed Release Aspirin Diclofenac Meclomen Sulindac  Arthritis pain formula Anacin Dicumarol Medipren Supac  Analgesic (Safety coated) Arthralgen Diffunasal Mefanamic Suprofen  Arthritis Strength Bufferin Dihydrocodeine Mepro Compound Suprol  Arthropan liquid Dopirydamole Methcarbomol with Aspirin Synalgos  ASA tablets/Enseals Disalcid Micrainin Tagament  Ascriptin Doan's Midol Talwin  Ascriptin A/D Dolene Mobidin Tanderil  Ascriptin Extra Strength Dolobid Moblgesic Ticlid  Ascriptin with Codeine Doloprin or Doloprin with Codeine Momentum Tolectin  Asperbuf Duoprin Mono-gesic Trendar  Aspergum Duradyne Motrin or Motrin IB Triminicin  Aspirin  plain, buffered or enteric coated Durasal Myochrisine Trigesic  Aspirin Suppositories Easprin Nalfon Trillsate  Aspirin with Codeine Ecotrin Regular or Extra Strength Naprosyn Uracel  Atromid-S Efficin Naproxen Ursinus  Auranofin Capsules Elmiron Neocylate Vanquish  Axotal Emagrin Norgesic Verin  Azathioprine Empirin or Empirin with Codeine Normiflo Vitamin E  Azolid Emprazil Nuprin Voltaren  Bayer Aspirin plain, buffered or children's or timed BC Tablets or powders Encaprin Orgaran Warfarin Sodium  Buff-a-Comp Enoxaparin Orudis Zorpin  Buff-a-Comp with Codeine Equegesic Os-Cal-Gesic   Buffaprin Excedrin plain, buffered or Extra Strength Oxalid   Bufferin Arthritis Strength Feldene Oxphenbutazone   Bufferin plain or Extra Strength Feldene Capsules Oxycodone with Aspirin   Bufferin with Codeine Fenoprofen Fenoprofen Pabalate or Pabalate-SF   Buffets II Flogesic Panagesic   Buffinol plain or Extra Strength Florinal or Florinal with Codeine Panwarfarin   Buf-Tabs Flurbiprofen Penicillamine   Butalbital Compound Four-way cold tablets Penicillin   Butazolidin Fragmin Pepto-Bismol   Carbenicillin Geminisyn Percodan   Carna Arthritis Reliever Geopen Persantine   Carprofen Gold's salt Persistin   Chloramphenicol Goody's Phenylbutazone   Chloromycetin Haltrain Piroxlcam   Clmetidine heparin Plaquenil   Cllnoril Hyco-pap Ponstel   Clofibrate Hydroxy chloroquine Propoxyphen         Before stopping any of these medications, be sure to consult the physician who ordered them.  Some, such as Coumadin (Warfarin) are ordered to prevent or treat serious conditions such as "deep thrombosis", "pumonary embolisms", and other heart problems.  The amount of time that you may need off of the medication may also vary with the medication and the reason for which you were taking it.  If you are taking any of these medications, please make sure you notify your pain physician before you undergo any  procedures.         Pain Management Discharge Instructions  General Discharge Instructions :  If you need to reach your doctor call: Monday-Friday 8:00 am - 4:00 pm at [phone] or toll free [phone].  After clinic hours [phone] to have operator reach doctor.  Bring all of your medication bottles to all your appointments in the pain clinic.  To cancel or reschedule your appointment with Pain Management please remember to call 24 hours in advance to avoid a fee.  Refer to the educational materials which you have been given on: General Risks, I had my Procedure. Discharge Instructions, Post Sedation.  Post Procedure Instructions:  The drugs you were given will stay in your system until tomorrow, so for the next 24 hours you should   not drive, make any legal decisions or drink any alcoholic beverages.  You may eat anything you prefer, but it is better to start with liquids then soups and crackers, and gradually work up to solid foods.  Please notify your doctor immediately if you have any unusual bleeding, trouble breathing or pain that is not related to your normal pain.  Depending on the type of procedure that was done, some parts of your body may feel week and/or numb.  This usually clears up by tonight or the next day.  Walk with the use of an assistive device or accompanied by an adult for the 24 hours.  You may use ice on the affected area for the first 24 hours.  Put ice in a Ziploc bag and cover with a towel and place against area 15 minutes on 15 minutes off.  You may switch to heat after 24 hours.Intercostal Nerve Block Patient Information  Description: The twelve intercostal nerves arise from the first thru twelfth thoracic nerve roots.  The nerve begins at the spine and wraps around the body, lying in a groove underneath each rib.  Each intercostal nerve innervates a specific strip of skin and body walk of the abdomen and chest.  Therefore, injuries of the  chest wall or abdominal wall result in pain that is transmitted back to the brian via the intercostal nerves.  Examples of such injuries include rib fractures and incisions for lung and gall bladder surgery.  Occasionally, pain may persist long after an injury or surgical incision secondary to inflammation and irritation of the intercostal nerve.  The longstanding pain is known as intercostal neuralgia.  An intercostal nerve block is preformed to eliminate pain either temporarily or permanently.  A small needle is placed below the rib and local anesthetic (like Novocaine) and possibly steroid is injected.  Usually 2-4 intercostal nerves are blocked at a time depending on the problem.  The patient will experience a slight "pin-prick" sensation for each injection.  Shortly thereafter, the strip of skin that is innervated by the blocked intercostal nerve will feel numb.  Persistent pain that is only temporarily relieved with local anesthetic may require a more permanent block. This procedure is called Cryoneurolysis and entails placing a small probe beneath the rib to freeze the nerve.  Conditions that may be treated by intercostal nerve blocks:   Rib fractures  Longstanding pain from surgery of the chest or abdomen (intercostal neuralgia)  Pain from chest tubes  Pain from trauma to the chest  Preparation for the injections:  1. Do not eat any solid food or dairy products within 8 hours of your appointment. 2. You may drink clear liquids up to 3 hours before appointment.  Clear liquids include water, black coffee, juice or soda.  No milk or cream please. 3. You may take your regular medication, including pain medications, with a sip of water before your appointment.  Diabetics should hold regular insulin (if take separately) and take 1/2 normal NPH dose the morning of the procedure.   Carry some sugar containing items with you to your appointment. 4. A driver must accompany you and be prepared to  drive you home after your procedure. 5. Bring all your current medications with you. 6. An IV may be inserted and sedation may be given at the discretion of the physician. 7. A blood pressure cuff, EKG and other monitors will often be applied during the procedure.  Some patients may need to have extra oxygen administered  for a short period. 8. You will be asked to provide medical information, including your allergies, prior to the procedure.  We must know immediately if you are taking blood thinners (like Coumadin/Warfarin) or if you are allergic to IV iodine contrast (dye). We must know if you could possible be pregnant.  Possible side-effects:   Bleeding from needle site  Infection (rare)  Nerve injury (rare)  Numbness & tingling of skin  Collapsed lung requiring chest tube (rare)  Local anesthetic toxicity (rare)  Light-headedness (temporary)  Pain at injection site (several days)  Decreased blood pressure (temporary)  Shortness of breath  Jittery/shaking sensation (temporary)  Call if you experience:   Difficulty breathing or hives (go directly to the emergency room)  Redness, inflammation or drainage at the injection site  Severe pain at the site of the injection  Any new symptoms which are concerning   Please note:  Your pain may subside immediately but may return several hours after the injection.  Often, more than one injection is required to reduce the pain. Also, if several temporary blocks with local anesthetic are ineffective, a more permanent block with cryolysis may be necessary.  This will be discussed with you should this be the case.  If you have any questions, please call [phone] [facility]

## 2021-01-21 NOTE — Progress Notes (Signed)
Safety precautions to be maintained throughout the outpatient stay will include: orient to surroundings, keep bed in low position, maintain call bell within reach at all times, provide assistance with transfer out of bed and ambulation.  

## 2021-01-21 NOTE — Progress Notes (Signed)
PROVIDER NOTE: Information contained herein reflects review and annotations entered in association with encounter. Interpretation of such information and data should be left to medically-trained personnel. Information provided to patient can be located elsewhere in the medical record under "Patient Instructions". Document created using STT-dictation technology, any transcriptional errors that may result from process are unintentional.    Patient: Leslie Orr  Service Category: Procedure  Provider: Edward Jolly, MD  DOB: 06-16-2001  DOS: 01/21/2021  Location: [facility]  MRN: [id number]  Setting: Ambulatory - outpatient  Referring Provider: Edward Jolly, MD  Type: Established Patient  Specialty: Interventional Pain Management  PCP: Cyndia Diver, PA-C   Primary Reason for Visit: Interventional Pain Management Treatment. CC: right rib pain  Procedure:          Anesthesia, Analgesia, Anxiolysis:  Type: Therapeutic Posterior Intercostal  Nerve Block  #2  Region: Posterior  Thoracic Area Level: T9, T10, T11 ribs Laterality: Right-Sided  Type: Local Anesthesia with PO Valium for anxiety  Local Anesthetic: Lidocaine 1-2%  Position: Prone   Indications: 1. Intercostal neuralgia (right side)   2. Neuropathic pain   3. Chronic pain syndrome    Pain Score: Pre-procedure: 8 /10 Post-procedure: 8 /10   Pre-op H&P Assessment:  Leslie Orr is a 20 y.o. (year old), female patient, seen today for interventional treatment. She  has a past surgical history that includes Other surgical history (Nov. 2001) and Back surgery (04/22/14 - 04/27/14). Leslie Orr has a current medication list which includes the following prescription(s): amitriptyline, bupropion, clonidine, cyclobenzaprine, dicyclomine, duloxetine, epipen 2-pak, esomeprazole, fluticasone, gabapentin, hydroxyzine, methylphenidate, multivitamin with minerals, ondansetron, promethazine, propranolol, ranitidine, riboflavin,  sumatriptan, azelastine, cetirizine, duloxetine, loperamide, magnesium gluconate, montelukast, ondansetron, pantoprazole, and prednisone.  Initial Vital Signs:  Pulse/HCG Rate: (!) 108ECG Heart Rate: 97 Temp: (!) 97.3 F (36.3 C) Resp: 18 BP: 121/68 SpO2: 100 %  BMI: Estimated body mass index is 28.32 kg/m as calculated from the following:   Height as of this encounter: 5\' 4"  (1.626 m).   Weight as of this encounter: 165 lb (74.8 kg).  Risk Assessment: Allergies: Reviewed. She is allergic to other, lactase, and penicillins.  Allergy Precautions: None required Coagulopathies: Reviewed. None identified.  Blood-thinner therapy: None at this time Active Infection(s): Reviewed. None identified. Leslie Orr is afebrile  Site Confirmation: Leslie Orr was asked to confirm the procedure and laterality before marking the site Procedure checklist: Completed Consent: Before the procedure and under the influence of no sedative(s), amnesic(s), or anxiolytics, the patient was informed of the treatment options, risks and possible complications. To fulfill our ethical and legal obligations, as recommended by the American Medical Association's Code of Ethics, I have informed the patient of my clinical impression; the nature and purpose of the treatment or procedure; the risks, benefits, and possible complications of the intervention; the alternatives, including doing nothing; the risk(s) and benefit(s) of the alternative treatment(s) or procedure(s); and the risk(s) and benefit(s) of doing nothing. The patient was provided information about the general risks and possible complications associated with the procedure. These may include, but are not limited to: failure to achieve desired goals, infection, bleeding, organ or nerve damage, allergic reactions, paralysis, and death. In addition, the patient was informed of those risks and complications associated to the procedure, such as failure to decrease pain;  infection; bleeding; organ or nerve damage with subsequent damage to sensory, motor, and/or autonomic systems, resulting in permanent pain, numbness, and/or weakness of one or several areas of the  body; allergic reactions; (i.e.: anaphylactic reaction); and/or death. Furthermore, the patient was informed of those risks and complications associated with the medications. These include, but are not limited to: allergic reactions (i.e.: anaphylactic or anaphylactoid reaction(s)); adrenal axis suppression; blood sugar elevation that in diabetics may result in ketoacidosis or comma; water retention that in patients with history of congestive heart failure may result in shortness of breath, pulmonary edema, and decompensation with resultant heart failure; weight gain; swelling or edema; medication-induced neural toxicity; particulate matter embolism and blood vessel occlusion with resultant organ, and/or nervous system infarction; and/or aseptic necrosis of one or more joints. Finally, the patient was informed that Medicine is not an exact science; therefore, there is also the possibility of unforeseen or unpredictable risks and/or possible complications that may result in a catastrophic outcome. The patient indicated having understood very clearly. We have given the patient no guarantees and we have made no promises. Enough time was given to the patient to ask questions, all of which were answered to the patient's satisfaction. Leslie Orr has indicated that she wanted to continue with the procedure. Attestation: I, the ordering provider, attest that I have discussed with the patient the benefits, risks, side-effects, alternatives, likelihood of achieving goals, and potential problems during recovery for the procedure that I have provided informed consent. Date  Time: 01/21/2021 12:40 PM  Pre-Procedure Preparation:  Monitoring: As per clinic protocol. Respiration, ETCO2, SpO2, BP, heart rate and rhythm monitor placed  and checked for adequate function Safety Precautions: Patient was assessed for positional comfort and pressure points before starting the procedure. Time-out: I initiated and conducted the "Time-out" before starting the procedure, as per protocol. The patient was asked to participate by confirming the accuracy of the "Time Out" information. Verification of the correct person, site, and procedure were performed and confirmed by me, the nursing staff, and the patient. "Time-out" conducted as per Joint Commission's Universal Protocol (UP.01.01.01). Time: 1322  Description of Procedure:          Target Area: The sub-costal neurovascular bundle area Approach: Sub-costal approach Area Prepped: Entire Posterior  Thoracic Region DuraPrep (Iodine Povacrylex [0.7% available iodine] and Isopropyl Alcohol, 74% w/w) Safety Precautions: Aspiration looking for blood return was conducted prior to all injections. At no point did we inject any substances, as a needle was being advanced. No attempts were made at seeking any paresthesias. Safe injection practices and needle disposal techniques used. Medications properly checked for expiration dates. SDV (single dose vial) medications used. Description of the Procedure: Protocol guidelines were followed. The patient was placed in position over the procedure table. The target area was identified and the area prepped in the usual manner. Skin & deeper tissues infiltrated with local anesthetic. After cleaning the skin with an antiseptic solution, 1-2 mL of dilute local anesthetic was infiltrated subcutaneously at the planned injection site. The fingers of the palpating hand were used to straddle the insertion site at the inferior border of the rib and fix the skin to avoid unwanted skin movement. Appropriate amount of time allowed to pass for local anesthetics to take effect. The procedure needles were then advanced to the target area at an angle of approximately 20 cephalad to  the skin. Contact with the rib was made. While maintaining the same angle of insertion, the needle was walked off the inferior border of the rib as the skin was allowed to return to its initial position. Then the needle was advanced no more than 3 mm below the inferior  margin of the rib. Proper needle placement was secured. Negative aspiration confirmed. Following negative aspiration for blood or air, 3-5 mL of local anesthetic was injected. The solution injected in intermittent fashion, asking for systemic symptoms every 0.5cc of injectate. The needle was then removed and the area cleansed, making sure to leave some of the prepping solution back to take advantage of its long term bactericidal properties. Vitals:   01/21/21 1317 01/21/21 1323 01/21/21 1328 01/21/21 1332  BP: (!) 127/105 (!) 130/94 (!) 119/100 124/87  Pulse:      Resp: 18 16 18 16   Temp:      TempSrc:      SpO2: 100% 100% 100% 100%  Weight:      Height:        Start Time: 1322 hrs. End Time: 1332 hrs.   Materials:  Needle(s) Type: Regular needle Gauge: 25G Length: 3.5-in Medication(s): Please see orders for medications and dosing details. 9 cc solution made of 7 cc of 0.2% ropivacaine, 2 cc of Decadron 10 mg/cc.  3 cc injected at each level above after confirmation via contrast Imaging Guidance (Non-Spinal):          Type of Imaging Technique: Fluoroscopy Guidance (Non-Spinal) Indication(s): Assistance in needle guidance and placement for procedures requiring needle placement in or near specific anatomical locations not easily accessible without such assistance. Exposure Time: Please see nurses notes. Contrast: Before injecting any contrast, we confirmed that the patient did not have an allergy to iodine, shellfish, or radiological contrast. Once satisfactory needle placement was completed at the desired level, radiological contrast was injected. Contrast injected under live fluoroscopy. No contrast complications. See chart  for type and volume of contrast used. Fluoroscopic Guidance: I was personally present during the use of fluoroscopy. "Tunnel Vision Technique" used to obtain the best possible view of the target area. Parallax error corrected before commencing the procedure. "Direction-depth-direction" technique used to introduce the needle under continuous pulsed fluoroscopy. Once target was reached, antero-posterior, oblique, and lateral fluoroscopic projection used confirm needle placement in all planes. Images permanently stored in EMR. Interpretation: I personally interpreted the imaging intraoperatively. Adequate needle placement confirmed in multiple planes. Appropriate spread of contrast into desired area was observed. No evidence of afferent or efferent intravascular uptake. Permanent images saved into the patient's record.   Post-operative Assessment:  Post-procedure Vital Signs:  Pulse/HCG Rate: (!) 10888 Temp: (!) 97.3 F (36.3 C) Resp: 16 BP: 124/87 SpO2: 100 %  EBL: None  Complications: No immediate post-treatment complications observed by team, or reported by patient.  Note: The patient tolerated the entire procedure well. A repeat set of vitals were taken after the procedure and the patient was kept under observation following institutional policy, for this type of procedure. Post-procedural neurological assessment was performed, showing return to baseline, prior to discharge. The patient was provided with post-procedure discharge instructions, including a section on how to identify potential problems. Should any problems arise concerning this procedure, the patient was given instructions to immediately contact us, at any time, without hesitation. In any case, we plan to contact the patient by telephone for a follow-up status report regarding this interventional procedure.  Comments:  No additional relevant information.  Plan of Care  Orders:  Orders Placed This Encounter  Procedures  .  TRIGGER POINT INJECTION    Standing Status:   Future    Standing Expiration Date:   04/22/2021    Scheduling Instructions:     Bilateral shoulder TPI w/o sedation  Order Specific Question:   Where will this procedure be performed?    Answer:   [facility]  . DG [facility] C-ARM 1-60 MIN NO REPORT    Intraoperative interpretation by procedural physician at [facility].    Standing Status:   Standing    Number of Occurrences:   1    Order Specific Question:   Reason for exam:    Answer:   Assistance in needle guidance and placement for procedures requiring needle placement in or near specific anatomical locations not easily accessible without such assistance.    Medications ordered for procedure: Meds ordered this encounter  Medications  . diazepam (VALIUM) tablet 5 mg  . diazepam (VALIUM) tablet 2 mg  . iohexol (OMNIPAQUE) 180 MG/ML injection 10 mL    Must be Myelogram-compatible. If not available, you may substitute with a water-soluble, non-ionic, hypoallergenic, myelogram-compatible radiological contrast medium.  Marland Kitchen lidocaine (XYLOCAINE) 2 % (with pres) injection 400 mg  . ropivacaine (PF) 2 mg/mL (0.2%) (NAROPIN) injection 9 mL  . dexamethasone (DECADRON) injection 10 mg  . dexamethasone (DECADRON) injection 10 mg   Medications administered: We administered diazepam, diazepam, iohexol, lidocaine, ropivacaine (PF) 2 mg/mL (0.2%), dexamethasone, and dexamethasone.  See the medical record for exact dosing, route, and time of administration.  Follow-up plan:   No follow-ups on file.      Right T9, T10, T11 intercostal nerve block 11/12/2020.   Recent Visits Date Type Provider Dept  01/08/21 Telemedicine Edward Jolly, MD [facility]  11/12/20 Procedure visit Edward Jolly, MD [facility]  Showing recent visits within past 90 days and meeting all other requirements Today's Visits Date Type Provider Dept  01/21/21 Procedure visit Edward Jolly, MD [facility]  Showing today's visits and meeting all other requirements Future Appointments No visits were found meeting these conditions. Showing future appointments within next 90 days and meeting all other requirements  Disposition: Discharge home  Discharge (Date  Time): 01/21/2021; 1345 hrs.   Primary Care Physician: Cyndia Diver, PA-C Location: [facility] Note by: Edward Jolly, MD Date: 01/21/2021; Time: 1:51 PM  Disclaimer:  Medicine is not an exact science. The only guarantee in medicine is that nothing is guaranteed. It is important to note that the decision to proceed with this intervention was based on the information collected from the patient. The Data and conclusions were drawn from the patient's questionnaire, the interview, and the physical examination. Because the information was provided in large part by the patient, it cannot be guaranteed that it has not been purposely or unconsciously manipulated. Every effort has been made to obtain as much relevant data as possible for this evaluation. It is important to note that the conclusions that lead to this procedure are derived in large part from the available data. Always take into account that the treatment will also be dependent on availability of resources and existing treatment guidelines, considered by other Pain Management Practitioners as being common knowledge and practice, at the time of the intervention. For Medico-Legal purposes, it is also important to point out that variation in procedural techniques and pharmacological choices are the acceptable norm. The indications, contraindications, technique, and results of the above procedure should only be interpreted and judged by a Board-Certified Interventional Pain Specialist with extensive familiarity and expertise in the same exact procedure and technique.

## 2021-01-22 ENCOUNTER — Telehealth: Payer: Self-pay

## 2021-01-22 NOTE — Telephone Encounter (Signed)
Patient was called and no problems reported. 

## 2021-01-28 ENCOUNTER — Encounter: Payer: Self-pay | Admitting: Student in an Organized Health Care Education/Training Program

## 2021-01-28 ENCOUNTER — Other Ambulatory Visit: Payer: Self-pay

## 2021-01-28 ENCOUNTER — Ambulatory Visit
Payer: Medicaid Other | Attending: Student in an Organized Health Care Education/Training Program | Admitting: Student in an Organized Health Care Education/Training Program

## 2021-01-28 VITALS — BP 154/94 | HR 100 | Temp 97.2°F | Resp 18 | Ht 64.0 in | Wt 175.0 lb

## 2021-01-28 DIAGNOSIS — M7918 Myalgia, other site: Secondary | ICD-10-CM | POA: Diagnosis present

## 2021-01-28 DIAGNOSIS — G894 Chronic pain syndrome: Secondary | ICD-10-CM | POA: Insufficient documentation

## 2021-01-28 MED ORDER — ROPIVACAINE HCL 2 MG/ML IJ SOLN
9.0000 mL | Freq: Once | INTRAMUSCULAR | Status: AC
  Administered 2021-01-28: 20 mL via PERINEURAL

## 2021-01-28 MED ORDER — ROPIVACAINE HCL 2 MG/ML IJ SOLN
INTRAMUSCULAR | Status: AC
  Filled 2021-01-28: qty 10

## 2021-01-28 NOTE — Progress Notes (Signed)
Safety precautions to be maintained throughout the outpatient stay will include: orient to surroundings, keep bed in low position, maintain call bell within reach at all times, provide assistance with transfer out of bed and ambulation.  

## 2021-01-28 NOTE — Progress Notes (Signed)
PROVIDER NOTE: Information contained herein reflects review and annotations entered in association with encounter. Interpretation of such information and data should be left to medically-trained personnel. Information provided to patient can be located elsewhere in the medical record under "Patient Instructions". Document created using STT-dictation technology, any transcriptional errors that may result from process are unintentional.    Patient: Leslie Orr  Service Category: Procedure  Provider: Edward Jolly, MD  DOB: [birth year]  DOS: 01/28/2021  Location: [facility]  MRN: [id number]  Setting: Ambulatory - outpatient  Referring Provider: Edward Jolly, MD  Type: Established Patient  Specialty: Interventional Pain Management  PCP: Cyndia Diver, PA-C   Primary Reason for Visit: Interventional Pain Management Treatment. CC: Shoulder Pain (bilateral)  Procedure:          Anesthesia, Analgesia, Anxiolysis:  Type: Trigger Point Injection (2+muscle groups) Trapezius, Rhomboids, Latissimus Dorsi  #1  CPT: 20553 Primary Purpose: Therapeutic Periscapular region Laterality: Midline        Type: Local Anesthesia Indication(s): Analgesia         Local Anesthetic: Lidocaine 1-2% Route: Infiltration ([place]/IM) IV Access: Declined Sedation: Declined   Position: Prone   Indications: 1. Myofascial pain syndrome of lumbar spine   2. Chronic pain syndrome    Pain Score: Pre-procedure: 9 /10 Post-procedure: 2 /10   Pre-op H&P Assessment:  Leslie Orr is a 20 y.o. (year old), female patient, seen today for interventional treatment. She  has a past surgical history that includes Other surgical history (Nov. 2001) and Back surgery (04/22/14 - 04/27/14). Leslie Orr has a current medication list which includes the following prescription(s): amitriptyline, bupropion, clonidine, cyclobenzaprine, dicyclomine, duloxetine, fluticasone, gabapentin, hydroxyzine, methylphenidate, multivitamin  with minerals, ondansetron, promethazine, propranolol, ranitidine, riboflavin, sumatriptan, azelastine, cetirizine, duloxetine, epipen 2-pak, esomeprazole, loperamide, magnesium gluconate, montelukast, ondansetron, pantoprazole, and prednisone. Her primarily concern today is the Shoulder Pain (bilateral)  Initial Vital Signs:  Pulse/HCG Rate: 100  Temp: (!) 97.2 F (36.2 C) Resp: 18 BP: (!) 154/94 SpO2: 100 %  BMI: Estimated body mass index is 30.04 kg/m as calculated from the following:   Height as of this encounter: 5\' 4"  (1.626 m).   Weight as of this encounter: 175 lb (79.4 kg).  Risk Assessment: Allergies: Reviewed. She is allergic to other, lactase, and penicillins.  Allergy Precautions: None required Coagulopathies: Reviewed. None identified.  Blood-thinner therapy: None at this time Active Infection(s): Reviewed. None identified. Leslie Orr is afebrile  Site Confirmation: Leslie Orr was asked to confirm the procedure and laterality before marking the site Procedure checklist: Completed Consent: Before the procedure and under the influence of no sedative(s), amnesic(s), or anxiolytics, the patient was informed of the treatment options, risks and possible complications. To fulfill our ethical and legal obligations, as recommended by the American Medical Association's Code of Ethics, I have informed the patient of my clinical impression; the nature and purpose of the treatment or procedure; the risks, benefits, and possible complications of the intervention; the alternatives, including doing nothing; the risk(s) and benefit(s) of the alternative treatment(s) or procedure(s); and the risk(s) and benefit(s) of doing nothing. The patient was provided information about the general risks and possible complications associated with the procedure. These may include, but are not limited to: failure to achieve desired goals, infection, bleeding, organ or nerve damage, allergic reactions,  paralysis, and death. In addition, the patient was informed of those risks and complications associated to the procedure, such as failure to decrease pain; infection; bleeding; organ or nerve damage  with subsequent damage to sensory, motor, and/or autonomic systems, resulting in permanent pain, numbness, and/or weakness of one or several areas of the body; allergic reactions; (i.e.: anaphylactic reaction); and/or death. Furthermore, the patient was informed of those risks and complications associated with the medications. These include, but are not limited to: allergic reactions (i.e.: anaphylactic or anaphylactoid reaction(s)); adrenal axis suppression; blood sugar elevation that in diabetics may result in ketoacidosis or comma; water retention that in patients with history of congestive heart failure may result in shortness of breath, pulmonary edema, and decompensation with resultant heart failure; weight gain; swelling or edema; medication-induced neural toxicity; particulate matter embolism and blood vessel occlusion with resultant organ, and/or nervous system infarction; and/or aseptic necrosis of one or more joints. Finally, the patient was informed that Medicine is not an exact science; therefore, there is also the possibility of unforeseen or unpredictable risks and/or possible complications that may result in a catastrophic outcome. The patient indicated having understood very clearly. We have given the patient no guarantees and we have made no promises. Enough time was given to the patient to ask questions, all of which were answered to the patient's satisfaction. Leslie Orr has indicated that she wanted to continue with the procedure. Attestation: I, the ordering provider, attest that I have discussed with the patient the benefits, risks, side-effects, alternatives, likelihood of achieving goals, and potential problems during recovery for the procedure that I have provided informed consent. Date   Time: 01/28/2021 11:56 AM  Pre-Procedure Preparation:  Monitoring: As per clinic protocol. Respiration, ETCO2, SpO2, BP, heart rate and rhythm monitor placed and checked for adequate function Safety Precautions: Patient was assessed for positional comfort and pressure points before starting the procedure. Time-out: I initiated and conducted the "Time-out" before starting the procedure, as per protocol. The patient was asked to participate by confirming the accuracy of the "Time Out" information. Verification of the correct person, site, and procedure were performed and confirmed by me, the nursing staff, and the patient. "Time-out" conducted as per Joint Commission's Universal Protocol (UP.01.01.01). Time: 1215  Description of Procedure:          Area Prepped: Entire periscapular region, trapezius region DuraPrep (Iodine Povacrylex [0.7% available iodine] and Isopropyl Alcohol, 74% w/w) Safety Precautions: Aspiration looking for blood return was conducted prior to all injections. At no point did we inject any substances, as a needle was being advanced. No attempts were made at seeking any paresthesias. Safe injection practices and needle disposal techniques used. Medications properly checked for expiration dates. SDV (single dose vial) medications used. Description of the Procedure: Protocol guidelines were followed. The patient was placed in position over the fluoroscopy table. The target area was identified and the area prepped in the usual manner. Skin & deeper tissues infiltrated with local anesthetic. Appropriate amount of time allowed to pass for local anesthetics to take effect. The procedure needles were then advanced to the target area. Proper needle placement secured. Negative aspiration confirmed. Solution injected in intermittent fashion, asking for systemic symptoms every 0.5cc of injectate. The needles were then removed and the area cleansed, making sure to leave some of the prepping  solution back to take advantage of its long term bactericidal properties.  Vitals:   01/28/21 1204  BP: (!) 154/94  Pulse: 100  Resp: 18  Temp: (!) 97.2 F (36.2 C)  TempSrc: Temporal  SpO2: 100%  Weight: 175 lb (79.4 kg)  Height: 5\' 4"  (1.626 m)    Start Time: 1215 hrs.  End Time: 1225 hrs. Materials:  Needle(s) Type: Regular needle Gauge: 22G Length: 3.5-in Medication(s): Please see orders for medications and dosing details. Approximately 20 triggerpoints injected with 0.5 to 1 cc of 0.2% ropivacaine  Dry needling was also performed in these regions.  Post-operative Assessment:  Post-procedure Vital Signs:  Pulse/HCG Rate: 100  Temp: (!) 97.2 F (36.2 C) Resp: 18 BP: (!) 154/94 SpO2: 100 %  EBL: None  Complications: No immediate post-treatment complications observed by team, or reported by patient.  Note: The patient tolerated the entire procedure well. A repeat set of vitals were taken after the procedure and the patient was kept under observation following institutional policy, for this type of procedure. Post-procedural neurological assessment was performed, showing return to baseline, prior to discharge. The patient was provided with post-procedure discharge instructions, including a section on how to identify potential problems. Should any problems arise concerning this procedure, the patient was given instructions to immediately contact us, at any time, without hesitation. In any case, we plan to contact the patient by telephone for a follow-up status report regarding this interventional procedure.  Comments:  No additional relevant information.  Plan of Care   Medications ordered for procedure: Meds ordered this encounter  Medications  . ropivacaine (PF) 2 mg/mL (0.2%) (NAROPIN) injection 9 mL   Medications administered: We administered ropivacaine (PF) 2 mg/mL (0.2%).  See the medical record for exact dosing, route, and time of administration.  Follow-up  plan:   Return in about 5 weeks (around 03/04/2021) for Post Procedure Evaluation, virtual.      Right T9, T10, T11 intercostal nerve block 11/12/2020.    Recent Visits Date Type Provider Dept  01/21/21 Procedure visit Edward Jolly, MD [facility]  01/08/21 Telemedicine Edward Jolly, MD [facility]  11/12/20 Procedure visit Edward Jolly, MD [facility]  Showing recent visits within past 90 days and meeting all other requirements Today's Visits Date Type Provider Dept  01/28/21 Procedure visit Edward Jolly, MD [facility]  Showing today's visits and meeting all other requirements Future Appointments Date Type Provider Dept  03/04/21 Appointment Edward Jolly, MD [facility]  Showing future appointments within next 90 days and meeting all other requirements  Disposition: Discharge home  Discharge (Date  Time): 01/28/2021; 1227 hrs.   Primary Care Physician: Cyndia Diver, PA-C Location: [facility] Note by: Edward Jolly, MD Date: 01/28/2021; Time: 12:28 PM  Disclaimer:  Medicine is not an exact science. The only guarantee in medicine is that nothing is guaranteed. It is important to note that the decision to proceed with this intervention was based on the information collected from the patient. The Data and conclusions were drawn from the patient's questionnaire, the interview, and the physical examination. Because the information was provided in large part by the patient, it cannot be guaranteed that it has not been purposely or unconsciously manipulated. Every effort has been made to obtain as much relevant data as possible for this evaluation. It is important to note that the conclusions that lead to this procedure are derived in large part from the available data. Always take into account that the treatment will also be dependent on availability of resources and existing treatment guidelines, considered  by other Pain Management Practitioners as being common knowledge and practice, at the time of the intervention. For Medico-Legal purposes, it is also important to point out that variation in procedural techniques and pharmacological choices are the acceptable norm. The indications,  contraindications, technique, and results of the above procedure should only be interpreted and judged by a Board-Certified Interventional Pain Specialist with extensive familiarity and expertise in the same exact procedure and technique.

## 2021-01-29 ENCOUNTER — Telehealth: Payer: Self-pay | Admitting: *Deleted

## 2021-01-29 NOTE — Telephone Encounter (Signed)
Attempted to call for post procedure follow-up. Message left. 

## 2021-02-04 ENCOUNTER — Other Ambulatory Visit (INDEPENDENT_AMBULATORY_CARE_PROVIDER_SITE_OTHER): Payer: Self-pay | Admitting: Neurology

## 2021-02-04 NOTE — Telephone Encounter (Signed)
Please escribe

## 2021-02-11 ENCOUNTER — Encounter (INDEPENDENT_AMBULATORY_CARE_PROVIDER_SITE_OTHER): Payer: Self-pay

## 2021-02-17 ENCOUNTER — Other Ambulatory Visit (INDEPENDENT_AMBULATORY_CARE_PROVIDER_SITE_OTHER): Payer: Self-pay | Admitting: Neurology

## 2021-02-17 NOTE — Telephone Encounter (Signed)
Please advise the max number of times patient is to take this medication

## 2021-02-19 ENCOUNTER — Encounter (INDEPENDENT_AMBULATORY_CARE_PROVIDER_SITE_OTHER): Payer: Self-pay

## 2021-02-26 ENCOUNTER — Other Ambulatory Visit (INDEPENDENT_AMBULATORY_CARE_PROVIDER_SITE_OTHER): Payer: Self-pay | Admitting: Neurology

## 2021-02-27 ENCOUNTER — Telehealth (INDEPENDENT_AMBULATORY_CARE_PROVIDER_SITE_OTHER): Payer: Self-pay | Admitting: Neurology

## 2021-02-27 NOTE — Telephone Encounter (Signed)
Please authorize.

## 2021-03-02 ENCOUNTER — Encounter: Payer: Self-pay | Admitting: Physician Assistant

## 2021-03-02 ENCOUNTER — Ambulatory Visit: Payer: Medicaid Other

## 2021-03-02 ENCOUNTER — Other Ambulatory Visit: Payer: Self-pay

## 2021-03-02 ENCOUNTER — Ambulatory Visit (LOCAL_COMMUNITY_HEALTH_CENTER): Payer: Medicaid Other | Admitting: Physician Assistant

## 2021-03-02 VITALS — BP 130/86 | Ht 64.0 in | Wt 172.2 lb

## 2021-03-02 DIAGNOSIS — Z113 Encounter for screening for infections with a predominantly sexual mode of transmission: Secondary | ICD-10-CM

## 2021-03-02 DIAGNOSIS — Z0000 Encounter for general adult medical examination without abnormal findings: Secondary | ICD-10-CM

## 2021-03-02 DIAGNOSIS — Z3042 Encounter for surveillance of injectable contraceptive: Secondary | ICD-10-CM

## 2021-03-02 DIAGNOSIS — Z3009 Encounter for other general counseling and advice on contraception: Secondary | ICD-10-CM

## 2021-03-02 MED ORDER — MEDROXYPROGESTERONE ACETATE 150 MG/ML IM SUSP
150.0000 mg | INTRAMUSCULAR | Status: AC
Start: 2021-03-02 — End: 2021-11-04
  Administered 2021-03-02 – 2021-11-04 (×4): 150 mg via INTRAMUSCULAR

## 2021-03-02 NOTE — Progress Notes (Signed)
Pt here for PE and Depo.  Depo 150 mg given IM without any complications. Corran Lalone M Rajon Bisig, RN  

## 2021-03-03 ENCOUNTER — Telehealth: Payer: Self-pay | Admitting: *Deleted

## 2021-03-03 NOTE — Telephone Encounter (Signed)
Attempted to call for pre appointment review of allergies/meds. Message left. 

## 2021-03-04 ENCOUNTER — Telehealth: Payer: Self-pay

## 2021-03-04 ENCOUNTER — Telehealth: Payer: Self-pay | Admitting: Student in an Organized Health Care Education/Training Program

## 2021-03-04 ENCOUNTER — Other Ambulatory Visit: Payer: Self-pay

## 2021-03-04 ENCOUNTER — Encounter: Payer: Self-pay | Admitting: Student in an Organized Health Care Education/Training Program

## 2021-03-04 ENCOUNTER — Ambulatory Visit
Payer: Medicaid Other | Attending: Student in an Organized Health Care Education/Training Program | Admitting: Student in an Organized Health Care Education/Training Program

## 2021-03-04 ENCOUNTER — Encounter: Payer: Self-pay | Admitting: Physician Assistant

## 2021-03-04 DIAGNOSIS — M7918 Myalgia, other site: Secondary | ICD-10-CM

## 2021-03-04 DIAGNOSIS — G894 Chronic pain syndrome: Secondary | ICD-10-CM

## 2021-03-04 DIAGNOSIS — M792 Neuralgia and neuritis, unspecified: Secondary | ICD-10-CM

## 2021-03-04 NOTE — Progress Notes (Signed)
[facility] DEPARTMENT [facility] [address] Main Number: [phone]    Family Planning Visit- Initial Visit  Subjective:  Leslie Orr is a 20 y.o.  [id number]   being seen today for an initial annual visit and to discuss contraceptive options.  The patient is currently using Depo Provera for pregnancy prevention. Patient reports she does not want a pregnancy in the next year.  Patient has the following medical conditions has Migraine without aura and without status migrainosus, not intractable; Tension headache; Idiopathic scoliosis; Episodic tension-type headache, not intractable; Neuropathic pain; Bruxism; Neuritis; Sleeping difficulty; Depression; Gastroesophageal reflux disease; ADHD; S/P lumbar spinal fusion; Intercostal neuralgia (right side); and Chronic pain syndrome on their problem list.  Chief Complaint  Patient presents with  . Contraception    Annual visit and continue with Depo    Patient reports that she has been doing well with the Depo and desires to continue with this as her BCM.  Reports that she has had frequent urination due to bladder spasms, nausea due to reflux, and more tension headaches than migraines lately.  States that later today she has an appointment with an adult neurologist to discuss headaches further.  Per chart review, CBE is due today and pap will be due in 2023.  Patient denies any other concerns.   Body mass index is 29.56 kg/m. - Patient is eligible for diabetes screening based on BMI and age >37?  not applicable HA1C ordered? not applicable  Patient reports 1  partner/s in last year. Desires STI screening?  Yes  Has patient been screened once for HCV in the past?  No  No results found for: HCVAB  Does the patient have current drug use (including MJ), have a partner with drug use, and/or has been incarcerated since last result? No  If yes-- Screen for HCV through [facility]   Does the patient  meet criteria for HBV testing? No  Criteria:  -Household, sexual or needle sharing contact with HBV -History of drug use -HIV positive -Those with known Hep C   Health Maintenance Due  Topic Date Due  . COVID-19 Vaccine (1) Never done  . HPV VACCINES (1 - 2-dose series) Never done  . CHLAMYDIA SCREENING  Never done  . Hepatitis C Screening  Never done  . TETANUS/TDAP  Never done    Review of Systems  All other systems reviewed and are negative.   The following portions of the patient's history were reviewed and updated as appropriate: allergies, current medications, past family history, past medical history, past social history, past surgical history and problem list. Problem list updated.   See flowsheet for other program required questions.  Objective:   Vitals:   03/02/21 1027  BP: 130/86  Weight: 172 lb 3.2 oz (78.1 kg)  Height: 5\' 4"  (1.626 m)    Physical Exam Vitals and nursing note reviewed.  Constitutional:      General: She is not in acute distress.    Appearance: Normal appearance.  HENT:     Head: Normocephalic and atraumatic.     Mouth/Throat:     Mouth: Mucous membranes are moist.     Pharynx: Oropharynx is clear. No oropharyngeal exudate or posterior oropharyngeal erythema.  Eyes:     Conjunctiva/sclera: Conjunctivae normal.  Neck:     Thyroid: No thyroid mass, thyromegaly or thyroid tenderness.  Cardiovascular:     Rate and Rhythm: Normal rate and regular rhythm.  Pulmonary:  Effort: Pulmonary effort is normal.     Breath sounds: Normal breath sounds.  Chest:  Breasts:     Right: Normal. No mass, nipple discharge, skin change, tenderness, axillary adenopathy or supraclavicular adenopathy.     Left: Normal. No mass, nipple discharge, skin change, tenderness, axillary adenopathy or supraclavicular adenopathy.    Abdominal:     Palpations: Abdomen is soft. There is no mass.     Tenderness: There is no abdominal tenderness. There is no  guarding or rebound.  Musculoskeletal:     Cervical back: Neck supple. No tenderness.  Lymphadenopathy:     Cervical: No cervical adenopathy.     Upper Body:     Right upper body: No supraclavicular, axillary or pectoral adenopathy.     Left upper body: No supraclavicular, axillary or pectoral adenopathy.  Skin:    General: Skin is warm and dry.     Findings: No bruising, erythema, lesion or rash.  Neurological:     Mental Status: She is alert and oriented to person, place, and time.  Psychiatric:        Mood and Affect: Mood normal.        Behavior: Behavior normal.        Thought Content: Thought content normal.        Judgment: Judgment normal.       Assessment and Plan:  Leslie Orr is a 20 y.o. female presenting to the [facility] Department for an initial annual wellness/contraceptive visit  Contraception counseling: Reviewed all forms of birth control options in the tiered based approach. available including abstinence; over the counter/barrier methods; hormonal contraceptive medication including pill, patch, ring, injection,contraceptive implant, ECP; hormonal and nonhormonal IUDs; permanent sterilization options including vasectomy and the various tubal sterilization modalities. Risks, benefits, and typical effectiveness rates were reviewed.  Questions were answered.  Written information was also given to the patient to review.  Patient desires to continue with Depo, this was prescribed for patient. She will follow up in  3 months and prn for surveillance.  She was told to call with any further questions, or with any concerns about this method of contraception.  Emphasized use of condoms 100% of the time for STI prevention.  Patient was not a candidate for ECP today.   1. Encounter for counseling regarding contraception Reviewed with patient normal SE of Depo and when to call clinic with concerns. Enc condoms with all sex for STD protection.  2. Screening  for STD (sexually transmitted disease) Await test results.  Counseled that RN will call if needs to RTC for treatment once results are back.  - Chlamydia/Gonorrhea [place] [facility] - HIV [place] [facility] - Syphilis Serology, [place] [facility]  3. Well woman exam (no gynecological exam) Reviewed with patient healthy habits to maintain general health. Enc MVI 1 po daily. Enc to establish with/ follow up with PCP for primary care concerns, age appropriate screenings and illness.  4. Surveillance for Depo-Provera contraception Continue Depo 150 mg IM q 11-13 weeks for 1 year. - medroxyPROGESTERone (DEPO-PROVERA) injection 150 mg     Return in about 3 months (around 06/02/2021) for Depo.  Future Appointments  Date Time Provider Department Center  03/04/2021  3:20 PM Edward Jolly, MD [facility] None    Matt Holmes, [place]

## 2021-03-04 NOTE — Telephone Encounter (Signed)
Attempted to call patient.  No answer.

## 2021-03-04 NOTE — Telephone Encounter (Signed)
Attempted to call patient, message left at home number, mobile number is not valid.

## 2021-03-04 NOTE — Progress Notes (Signed)
I attempted to call the patient however no response. Voicemail left instructing patient to call front desk office at [phone] to reschedule appointment. -Dr Cherylann Ratel   Post-Procedure Evaluation  Procedure (01/28/2021):   Type: Trigger Point Injection (2+muscle groups) Trapezius, Rhomboids, Latissimus Dorsi  #1  CPT: [id number] Primary Purpose: Therapeutic Periscapular region Laterality: Midline       Sedation: Please see nurses note.  Effectiveness during initial hour after procedure(Ultra-Short Term Relief): 100 %   Local anesthetic used: Long-acting (4-6 hours) Effectiveness: Defined as any analgesic benefit obtained secondary to the administration of local anesthetics. This carries significant diagnostic value as to the etiological location, or anatomical origin, of the pain. Duration of benefit is expected to coincide with the duration of the local anesthetic used.  Effectiveness during initial 4-6 hours after procedure(Short-Term Relief): 100 %   Long-term benefit: Defined as any relief past the pharmacologic duration of the local anesthetics.  Effectiveness past the initial 6 hours after procedure(Long-Term Relief): 75 % (for a week and a half)   Current benefits: Defined as benefit that persist at this time.   Analgesia:  50% improved Function: Back to baseline ROM: Back to baseline

## 2021-03-04 NOTE — Telephone Encounter (Signed)
LM  to call office for pre virtual appointment questions 

## 2021-03-06 LAB — HM HIV SCREENING LAB: HM HIV Screening: NEGATIVE

## 2021-05-07 ENCOUNTER — Other Ambulatory Visit (INDEPENDENT_AMBULATORY_CARE_PROVIDER_SITE_OTHER): Payer: Self-pay | Admitting: Neurology

## 2021-05-12 ENCOUNTER — Other Ambulatory Visit: Payer: Self-pay | Admitting: Physician Assistant

## 2021-05-12 DIAGNOSIS — G43009 Migraine without aura, not intractable, without status migrainosus: Secondary | ICD-10-CM

## 2021-05-18 ENCOUNTER — Other Ambulatory Visit: Payer: Self-pay

## 2021-05-18 ENCOUNTER — Ambulatory Visit
Admission: RE | Admit: 2021-05-18 | Discharge: 2021-05-18 | Disposition: A | Discharge status: Home / Self Care | Payer: Medicaid Other | Source: Ambulatory Visit | Attending: Physician Assistant | Admitting: Physician Assistant

## 2021-05-18 DIAGNOSIS — G43009 Migraine without aura, not intractable, without status migrainosus: Secondary | ICD-10-CM | POA: Insufficient documentation

## 2021-05-18 MED ORDER — GADOBUTROL 1 MMOL/ML IV SOLN
7.5000 mL | Freq: Once | INTRAVENOUS | Status: AC | PRN
  Administered 2021-05-18: 7.5 mL via INTRAVENOUS

## 2021-05-21 ENCOUNTER — Ambulatory Visit: Payer: Medicaid Other

## 2021-05-25 ENCOUNTER — Ambulatory Visit (LOCAL_COMMUNITY_HEALTH_CENTER): Payer: Medicaid Other

## 2021-05-25 ENCOUNTER — Other Ambulatory Visit: Payer: Self-pay

## 2021-05-25 VITALS — BP 119/77 | Ht 64.0 in | Wt 180.0 lb

## 2021-05-25 DIAGNOSIS — Z3042 Encounter for surveillance of injectable contraceptive: Secondary | ICD-10-CM

## 2021-05-25 DIAGNOSIS — Z3009 Encounter for other general counseling and advice on contraception: Secondary | ICD-10-CM

## 2021-05-25 NOTE — Progress Notes (Signed)
12 weeks post depo. Voices no concerns. Depo given today per order by Beatris Si, PA dated 03/02/2021. Tolerated well LUOQ. Next depo due 08/10/2021, pt aware. Jerel Shepherd, RN

## 2021-05-30 ENCOUNTER — Other Ambulatory Visit (INDEPENDENT_AMBULATORY_CARE_PROVIDER_SITE_OTHER): Payer: Self-pay | Admitting: Neurology

## 2021-06-01 NOTE — Telephone Encounter (Signed)
Appears to be seeing another neurologist now. I will approve refills once family confirms they want to continue with [place] and makes appointment.   Lorenz Coaster MD MPH

## 2021-08-13 ENCOUNTER — Ambulatory Visit (LOCAL_COMMUNITY_HEALTH_CENTER): Payer: Medicaid Other

## 2021-08-13 ENCOUNTER — Other Ambulatory Visit: Payer: Self-pay

## 2021-08-13 VITALS — BP 140/78 | Ht 64.0 in | Wt 177.5 lb

## 2021-08-13 DIAGNOSIS — Z23 Encounter for immunization: Secondary | ICD-10-CM

## 2021-08-13 DIAGNOSIS — Z719 Counseling, unspecified: Secondary | ICD-10-CM

## 2021-08-13 DIAGNOSIS — Z3042 Encounter for surveillance of injectable contraceptive: Secondary | ICD-10-CM

## 2021-08-13 DIAGNOSIS — Z3009 Encounter for other general counseling and advice on contraception: Secondary | ICD-10-CM

## 2021-08-13 NOTE — Progress Notes (Signed)
Influenza immunization given and tolerated well. [facility] updated and copy given. Ferrel Logan, LPN

## 2021-08-13 NOTE — Progress Notes (Signed)
DMPA 150 mg given IM (Right Glute) per 03/02/2021 order by Sadie Haber, PA and tolerated well.  Patient is 11 w 3 d post last Depo. Hart Carwin, RN Patient requested Flu vaccine today (see immunization visit) Hart Carwin, RN

## 2021-10-10 ENCOUNTER — Other Ambulatory Visit (INDEPENDENT_AMBULATORY_CARE_PROVIDER_SITE_OTHER): Payer: Self-pay | Admitting: Neurology

## 2021-10-13 NOTE — Telephone Encounter (Signed)
Attempted to call patient to see if she has started care with another neurologist or not. No answer not able to leave vm.

## 2021-10-26 IMAGING — MR MR HEAD WO/W CM
14 series · 48 of 48 positions shown · IV contrast (gadavist)
Comparison: No pertinent prior exams available for comparison.

CLINICAL DATA: Migraine without Maceo and without status
migrainosus, not intractable AXM.HHM (45K-C7-CM). Additional history
provided by scanning technologist: Patient reports a history of
migraines beginning around age 11, new recent daily headaches.

EXAM:
MRI HEAD WITHOUT AND WITH CONTRAST
TECHNIQUE: Multiplanar, multiecho pulse sequences of the brain and surrounding
structures were obtained without and with intravenous contrast.
CONTRAST:  7.5mL GADAVIST GADOBUTROL 1 MMOL/ML IV SOLN

[Series 5: ax dwi_tracew · axial · 3.0mm · 0.65mm/px · z∈[-114,+33]mm · 3 of 48 slices shown]
[im 1/48]
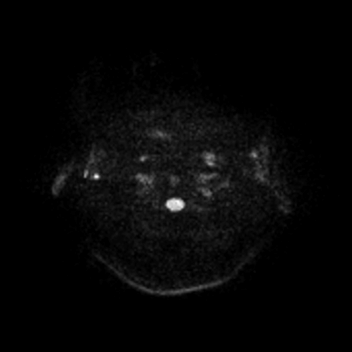
[im 24/48]
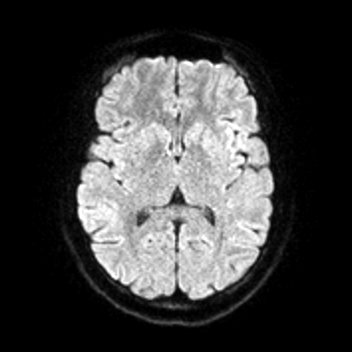
[im 48/48]
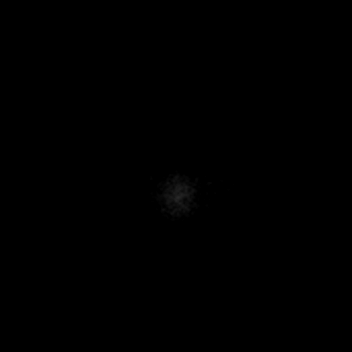

[Series 6: ax dwi_adc · axial · 3.0mm · 0.65mm/px · z∈[-114,+30]mm · 3 of 47 slices shown]
[im 1/47]
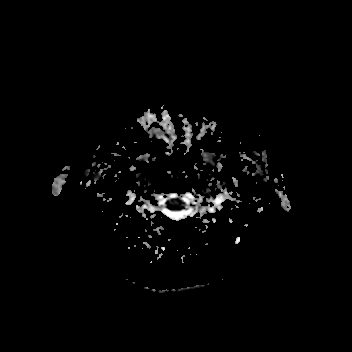
[im 24/47]
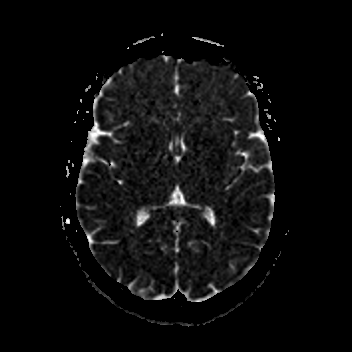
[im 47/47]
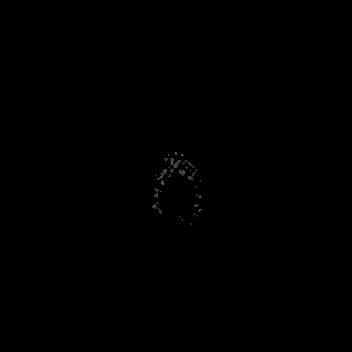

[Series 7: cor dwi_tracew · coronal · 5.0mm · 0.65mm/px · 2 of 40 slices shown]
[im 1/40]
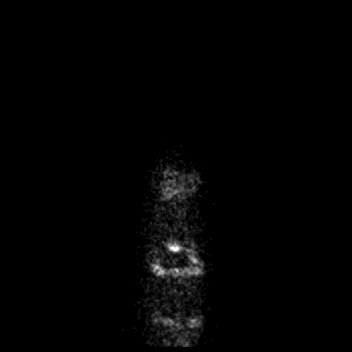
[im 40/40]
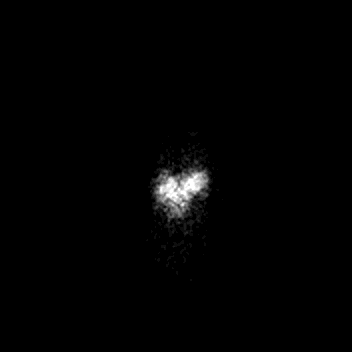

[Series 8: cor dwi_adc · coronal · 5.0mm · 0.65mm/px · 2 of 38 slices shown]
[im 1/38]
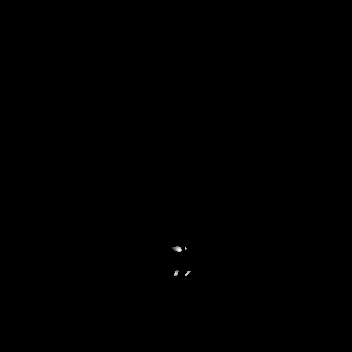
[im 38/38]
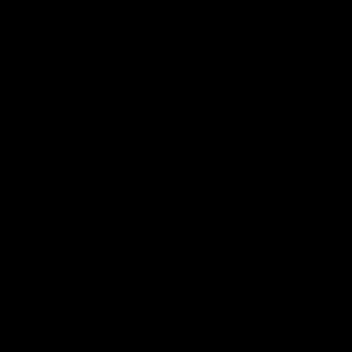

[Series 9: T1 · sagittal · 5.0mm · 0.62mm/px · 1 of 25 slices shown (1 of 2)]
[im 1/25]
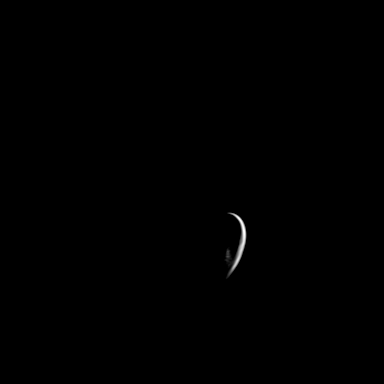

[Series 10: T2 · axial · 5.0mm · 0.53mm/px · 1 of 25 slices shown]
[im 1/25]
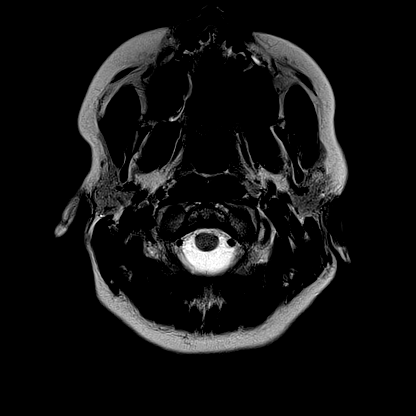

[Series 11: mag_images · axial · 3.0mm · 0.90mm/px · z∈[-124,+44]mm · 3 of 60 slices shown]
[im 1/60]
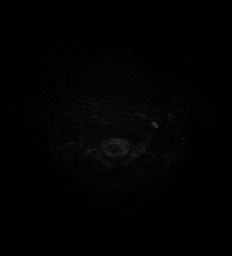
[im 30/60]
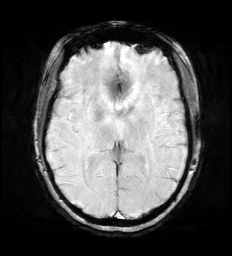
[im 60/60]
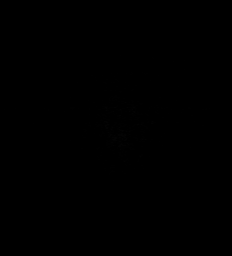

[Series 12: pha_images · axial · 3.0mm · 0.90mm/px · z∈[-124,+35]mm · 3 of 57 slices shown]
[im 1/57]
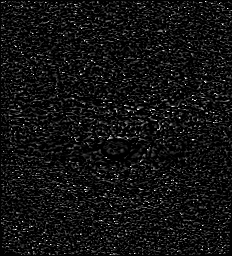
[im 29/57]
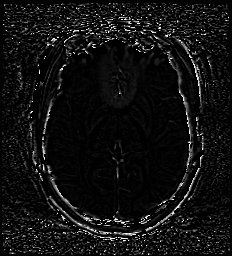
[im 57/57]
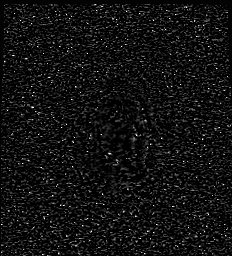

[Series 13: swi_images · axial · 3.0mm · 0.90mm/px · z∈[-124,+44]mm · 3 of 60 slices shown]
[im 1/60]
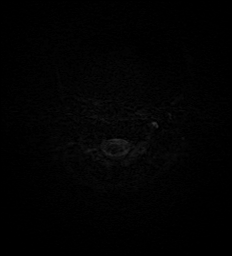
[im 30/60]
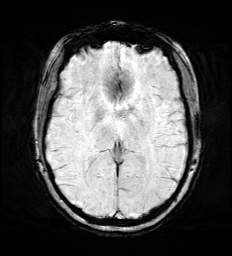
[im 60/60]
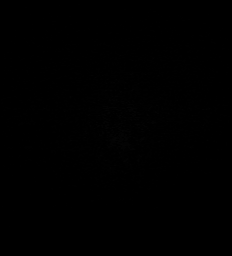

[Series 15: FLAIR · axial · 3.0mm · 0.53mm/px · z∈[-116,+38]mm · 3 of 55 slices shown]
[im 1/55]
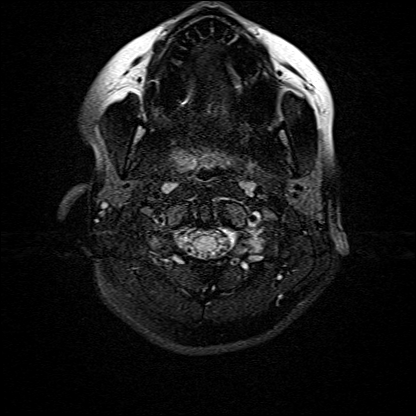
[im 28/55]
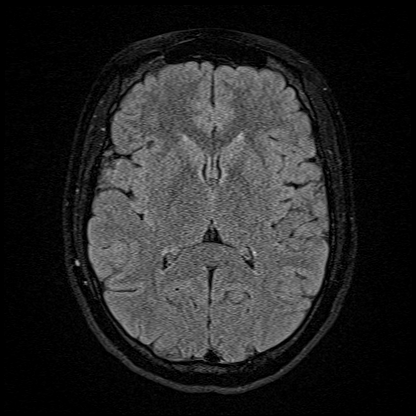
[im 55/55]
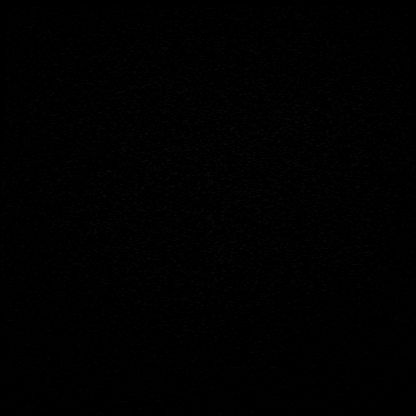

[Series 16: T1 · axial · 1.0mm · 0.98mm/px · z∈[-127,+38]mm · 10 of 175 slices shown (2 of 2)]
[im 1/175]
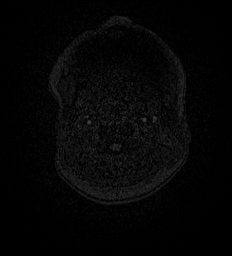
[im 20/175]
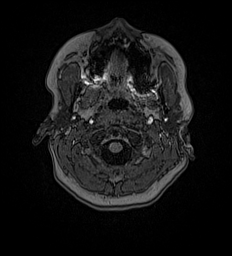
[im 39/175]
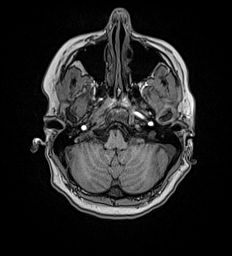
[im 59/175]
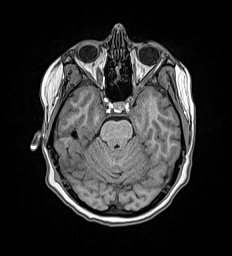
[im 78/175]
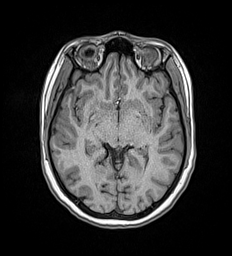
[im 97/175]
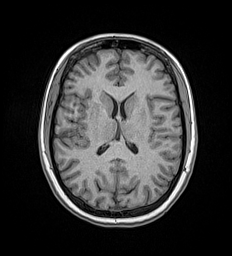
[im 117/175]
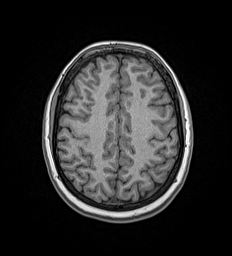
[im 136/175]
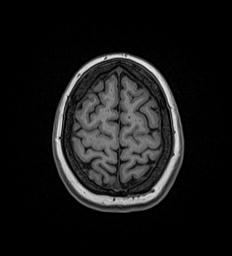
[im 155/175]
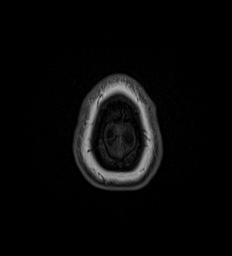
[im 175/175]
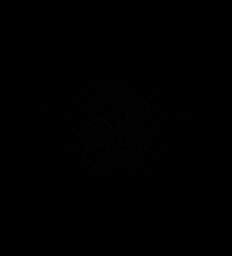

[Series 17: T2 post-contrast · coronal · 5.0mm · 0.57mm/px · 2 of 29 slices shown]
[im 1/29]
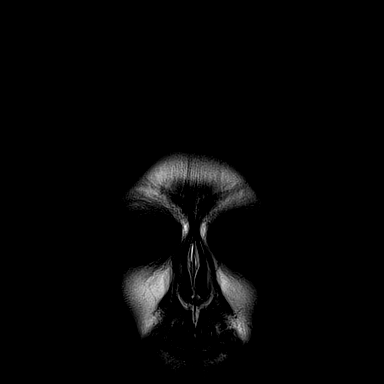
[im 29/29]
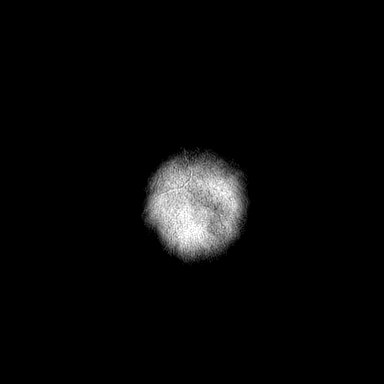

[Series 18: T1 post-contrast · axial · 1.0mm · 0.98mm/px · z∈[-127,+39]mm · 10 of 176 slices shown (1 of 2)]
[im 1/176]
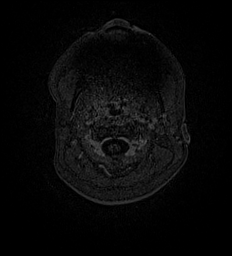
[im 20/176]
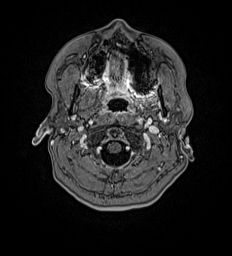
[im 39/176]
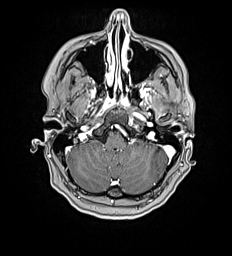
[im 59/176]
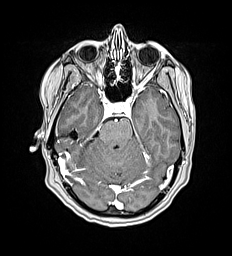
[im 78/176]
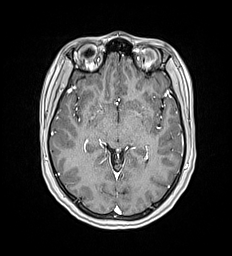
[im 98/176]
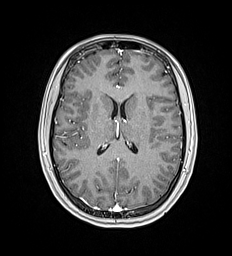
[im 117/176]
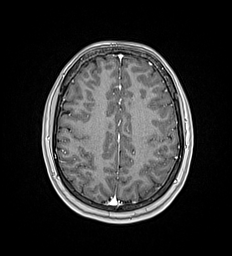
[im 137/176]
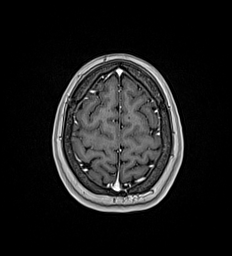
[im 156/176]
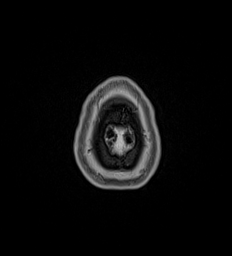
[im 176/176]
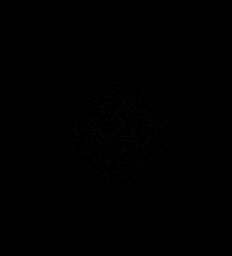

[Series 19: T1 post-contrast · coronal · 5.0mm · 0.57mm/px · 2 of 29 slices shown (2 of 2)]
[im 1/29]
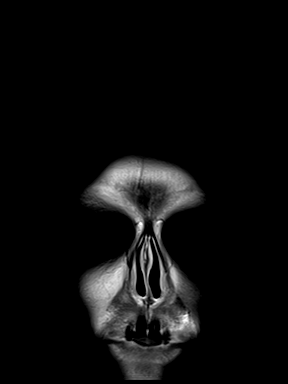
[im 29/29]
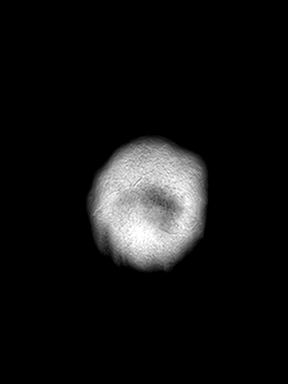

[48 of 48 positions shown; findings below may reference images not displayed]

FINDINGS: Brain:

Cerebral volume is normal.

No cortical encephalomalacia is identified. No significant cerebral
white matter disease.

11 x 5 mm pineal cyst without suspicious mass-like or nodular
enhancement.

There is no acute infarct.

No evidence of an intracranial mass.

No chronic intracranial blood products.

No extra-axial fluid collection.

No midline shift.

No pathologic intracranial enhancement.

Vascular: Expected proximal arterial flow voids.

Skull and upper cervical spine: No focal marrow lesion.

Sinuses/Orbits: Visualized orbits show no acute finding. No
significant paranasal sinus disease.
IMPRESSION: No evidence of acute intracranial abnormality.

11 x 5 mm pineal cyst. This is likely incidental. However, given the
patient's young age, 1 year brain MRI follow-up is recommended to
ensure size stability.

Otherwise unremarkable MRI appearance of the brain.

## 2021-11-04 ENCOUNTER — Ambulatory Visit (LOCAL_COMMUNITY_HEALTH_CENTER): Payer: Medicaid Other

## 2021-11-04 ENCOUNTER — Other Ambulatory Visit: Payer: Self-pay

## 2021-11-04 VITALS — BP 122/88 | Ht 64.0 in | Wt 179.5 lb

## 2021-11-04 DIAGNOSIS — Z30013 Encounter for initial prescription of injectable contraceptive: Secondary | ICD-10-CM | POA: Diagnosis not present

## 2021-11-04 DIAGNOSIS — Z3009 Encounter for other general counseling and advice on contraception: Secondary | ICD-10-CM

## 2021-11-04 DIAGNOSIS — Z309 Encounter for contraceptive management, unspecified: Secondary | ICD-10-CM | POA: Diagnosis not present

## 2021-11-04 NOTE — Progress Notes (Signed)
Tolerated Depo injection without complaint. Jaedynn Bohlken, RN  

## 2022-01-12 ENCOUNTER — Ambulatory Visit
Payer: Medicaid Other | Attending: Student in an Organized Health Care Education/Training Program | Admitting: Student in an Organized Health Care Education/Training Program

## 2022-01-12 ENCOUNTER — Encounter: Payer: Self-pay | Admitting: Student in an Organized Health Care Education/Training Program

## 2022-01-12 ENCOUNTER — Other Ambulatory Visit: Payer: Self-pay

## 2022-01-12 VITALS — BP 134/90 | HR 119 | Temp 98.1°F | Resp 18 | Ht 64.0 in | Wt 180.0 lb

## 2022-01-12 DIAGNOSIS — G894 Chronic pain syndrome: Secondary | ICD-10-CM | POA: Diagnosis not present

## 2022-01-12 DIAGNOSIS — M7918 Myalgia, other site: Secondary | ICD-10-CM | POA: Insufficient documentation

## 2022-01-12 DIAGNOSIS — M792 Neuralgia and neuritis, unspecified: Secondary | ICD-10-CM | POA: Diagnosis not present

## 2022-01-12 DIAGNOSIS — G588 Other specified mononeuropathies: Secondary | ICD-10-CM | POA: Insufficient documentation

## 2022-01-12 DIAGNOSIS — G43009 Migraine without aura, not intractable, without status migrainosus: Secondary | ICD-10-CM | POA: Insufficient documentation

## 2022-01-12 DIAGNOSIS — Z981 Arthrodesis status: Secondary | ICD-10-CM | POA: Diagnosis present

## 2022-01-12 MED ORDER — TIZANIDINE HCL 4 MG PO TABS: 4.0000 mg | ORAL_TABLET | Freq: Every evening | ORAL | 0 refills | Status: AC | PRN

## 2022-01-12 NOTE — Patient Instructions (Signed)

## 2022-01-12 NOTE — Progress Notes (Signed)
PROVIDER NOTE: Information contained herein reflects review and annotations entered in association with encounter. Interpretation of such information and data should be left to medically-trained personnel. Information provided to patient can be located elsewhere in the medical record under "Patient Instructions". Document created using STT-dictation technology, any transcriptional errors that may result from process are unintentional.  ?  ?Patient: Leslie Orr  Service Category: E/M  Provider: Gillis Santa, MD  ?DOB: 2001-08-01  DOS: 01/12/2022  Specialty: Interventional Pain Management  ?MRN: [id number]  Setting: Ambulatory outpatient  PCP: Dionicia Abler, PA-C  ?Type: Established Patient    Referring Provider: Dionicia Abler, PA-C  ?Location: Office  Delivery: Face-to-face    ? ?HPI  ?Ms. Leslie Orr, a 21 y.o. year old female, is here today because of her Chronic pain syndrome [G89.4]. Ms. Leslie Orr primary complain today is Back Pain and Neck Pain ?Last encounter: My last encounter with her was on 03/04/21 ?Pertinent problems: Ms. Leslie Orr has Migraine without aura and without status migrainosus, not intractable; Idiopathic scoliosis; Neuropathic pain; S/P lumbar spinal fusion; Intercostal neuralgia (right side); Chronic pain syndrome; and Myofascial pain syndrome of lumbar spine on their pertinent problem list. ?Pain Assessment: Severity of   is reported as a 6 /10. Location: Back Lower/radiates down right leg to the back of knee. Onset: More than a month ago. Quality: Sharp, Stabbing. Timing: Constant. Modifying factor(s): medications, heat. ?Vitals:  height is 5' 4" (1.626 m) and weight is 180 lb (81.6 kg). Her temperature is 98.1 ?F (36.7 ?C). Her blood pressure is 134/90 and her pulse is 119 (abnormal). Her respiration is 18 and oxygen saturation is 100%.  ? ?Reason for encounter:  requesting repeat TPI and refill of muscle relaxer .   ? ?Patient presents today for increased back pain related  to myofascial pain syndrome.  She is requesting a repeat of her trigger point injections which were done last May in 2022.  She states that these were helpful.  She is also requesting a refill of her muscle relaxer.  I encouraged her to not take this medication every day and only to take as needed when she is having breakthrough pain associated with muscle spasms.  Patient endorsed understanding. ? ?ROS  ?Constitutional: Denies any fever or chills ?Gastrointestinal: No reported hemesis, hematochezia, vomiting, or acute GI distress ?Musculoskeletal:  Myofascial pain ?Neurological: No reported episodes of acute onset apraxia, aphasia, dysarthria, agnosia, amnesia, paralysis, loss of coordination, or loss of consciousness ? ?Medication Review  ?ARIPiprazole, DULoxetine, SUMAtriptan, buPROPion, cetirizine, dicyclomine, esomeprazole, ferrous sulfate, fluticasone, gabapentin, hydrOXYzine, lisinopril, loperamide, methylPREDNISolone, methylphenidate, montelukast, multivitamin with minerals, nortriptyline, ondansetron, promethazine, and tiZANidine ? ?History Review  ?Allergy: Ms. Leslie Orr is allergic to other, penicillins, and tilactase. ?Drug: Ms. Leslie Orr  reports current drug use. Drug: Marijuana. ?Alcohol:  reports that she does not currently use alcohol. ?Tobacco:  reports that she has quit smoking. She has never used smokeless tobacco. ?Social: Ms. Leslie Orr  reports that she has quit smoking. She has never used smokeless tobacco. She reports that she does not currently use alcohol. She reports current drug use. Drug: Marijuana. ?Medical:  has a past medical history of Acid reflux, ADHD, Depression, Hypertension, Migraines, Nausea, Neuralgia, and Seasonal allergies. ?Surgical: Ms. Leslie Orr  has a past surgical history that includes Other surgical history (Nov. 2001) and Back surgery (04/22/14 - 04/27/14). ?Family: family history includes ADD / ADHD in her brother; Anxiety disorder in her mother; Arthritis in her maternal  grandmother; COPD in her paternal grandmother; Depression in  her maternal grandmother and mother; Diabetes in her paternal grandmother; Drug abuse in her mother; Emphysema in her paternal grandmother; Heart Problems in her maternal grandfather and mother; Hyperlipidemia in her maternal grandmother; Migraines in her father and maternal grandmother; Other in her father; Seizures in her father. ? ?Laboratory Chemistry Profile  ? ?Renal ?Lab Results  ?Component Value Date  ? BUN 12 11/06/2020  ? CREATININE 0.73 11/06/2020  ? BCR NOT APPLICABLE 95/06/3266  ? GFRAA >60 03/03/2020  ? GFRNONAA >60 11/06/2020  ?  Hepatic ?Lab Results  ?Component Value Date  ? AST 29 11/06/2020  ? ALT 38 11/06/2020  ? ALBUMIN 5.0 11/06/2020  ? ALKPHOS 74 11/06/2020  ? LIPASE 22 11/06/2020  ?  ?Electrolytes ?Lab Results  ?Component Value Date  ? NA 138 11/06/2020  ? K 4.5 11/06/2020  ? CL 101 11/06/2020  ? CALCIUM 10.3 11/06/2020  ? MG 2.0 08/03/2017  ?  Bone ?Lab Results  ?Component Value Date  ? VD25OH 27 (L) 08/03/2017  ?  ?Inflammation (CRP: Acute Phase) (ESR: Chronic Phase) ?Lab Results  ?Component Value Date  ? ESRSEDRATE 4 02/27/2015  ?    ?  ? ?Note: Above Lab results reviewed. ? ?Recent Imaging Review  ?MR BRAIN W WO CONTRAST ?CLINICAL DATA:  Migraine without aura and without status ?migrainosus, not intractable G43.009 (ICD-10-CM). Additional history ?provided by scanning technologist: Patient reports a history of ?migraines beginning around age 65, new recent daily headaches. ? ?EXAM: ?MRI HEAD WITHOUT AND WITH CONTRAST ? ?TECHNIQUE: ?Multiplanar, multiecho pulse sequences of the brain and surrounding ?structures were obtained without and with intravenous contrast. ? ?CONTRAST:  7.23m GADAVIST GADOBUTROL 1 MMOL/ML IV SOLN ? ?COMPARISON:  No pertinent prior exams available for comparison. ? ?FINDINGS: ?Brain: ? ?Cerebral volume is normal. ? ?No cortical encephalomalacia is identified. No significant cerebral ?white matter  disease. ? 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 ? ?There is no acute infarct. ? ?No evidence of an intracranial mass. ? ?No chronic intracranial blood products. ? ?No extra-axial fluid collection. ? ?No midline shift. ? ?No pathologic intracranial enhancement. ? ?Vascular: Expected proximal arterial flow voids. ? ?Skull and upper cervical spine: No focal marrow lesion. ? ?Sinuses/Orbits: Visualized orbits show no acute finding. No ?significant paranasal sinus disease. ? ?IMPRESSION: ?No evidence of acute intracranial abnormality. ? 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 However, given the ?patient's young age, 1 year brain MRI follow-up is recommended to ?ensure size stability. ? ?Otherwise unremarkable MRI appearance of the brain. ? ?Electronically Signed ?  By: KKellie SimmeringDO ?  On: 05/18/2021 18:55 ?Note: Reviewed       ? ?Physical Exam  ?General appearance: Well nourished, well developed, and well hydrated. In no apparent acute distress ?Mental status: Alert, oriented x 3 (person, place, & time)       ?Respiratory: No evidence of acute respiratory distress ?Eyes: PERLA ?Vitals: BP 134/90   Pulse (!) 119   Temp 98.1 ?F (36.7 ?C)   Resp 18   Ht 5' 4" (1.626 m)   Wt 180 lb (81.6 kg)   SpO2 100%   BMI 30.90 kg/m?  ?BMI: Estimated body mass index is 30.9 kg/m? as calculated from the following: ?  Height as of this encounter: 5' 4" (1.626 m). ?  Weight as of this encounter: 180 lb (81.6 kg). ?Ideal: Ideal body weight: 54.7 kg (120 lb 9.5 oz) ?Adjusted ideal body weight: 65.5 kg (144  lb 5.7 oz) ? ?Assessment  ? ?Status Diagnosis  ?Controlled ?Having a Flare-up ?Controlled 1. Chronic pain syndrome   ?2. Myofascial pain syndrome of lumbar spine   ?3. Neuropathic pain   ?4. Intercostal neuralgia (right side)   ?5. S/P lumbar spinal fusion   ?6. Migraine without aura and without status migrainosus, not intractable   ?  ? ?Updated Problems: ?Problem  ?Myofascial  Pain Syndrome of Lumbar Spine  ?S/P Lumbar Spinal Fusion  ?Intercostal neuralgia (right side)  ?Chronic Pain Syndrome  ?Neuropathic Pain  ?Migraine Without Aura and Without Status Migrainosus, Not Intractable  ?Idiopathic Scoli

## 2022-01-12 NOTE — Progress Notes (Signed)
Safety precautions to be maintained throughout the outpatient stay will include: orient to surroundings, keep bed in low position, maintain call bell within reach at all times, provide assistance with transfer out of bed and ambulation.  

## 2022-01-25 ENCOUNTER — Ambulatory Visit
Payer: Medicaid Other | Attending: Student in an Organized Health Care Education/Training Program | Admitting: Student in an Organized Health Care Education/Training Program

## 2022-01-25 ENCOUNTER — Encounter: Payer: Self-pay | Admitting: Student in an Organized Health Care Education/Training Program

## 2022-01-25 VITALS — BP 123/72 | HR 114 | Temp 98.1°F | Resp 16 | Ht 64.0 in | Wt 180.0 lb

## 2022-01-25 DIAGNOSIS — M7918 Myalgia, other site: Secondary | ICD-10-CM | POA: Insufficient documentation

## 2022-01-25 DIAGNOSIS — M792 Neuralgia and neuritis, unspecified: Secondary | ICD-10-CM | POA: Diagnosis present

## 2022-01-25 DIAGNOSIS — G894 Chronic pain syndrome: Secondary | ICD-10-CM | POA: Diagnosis present

## 2022-01-25 MED ORDER — ROPIVACAINE HCL 2 MG/ML IJ SOLN
INTRAMUSCULAR | Status: AC
  Filled 2022-01-25: qty 20

## 2022-01-25 MED ORDER — ROPIVACAINE HCL 2 MG/ML IJ SOLN
9.0000 mL | Freq: Once | INTRAMUSCULAR | Status: AC
  Administered 2022-01-25: 9 mL via PERINEURAL

## 2022-01-25 NOTE — Patient Instructions (Signed)

## 2022-01-25 NOTE — Progress Notes (Signed)
PROVIDER NOTE: Information contained herein reflects review and annotations entered in association with encounter. Interpretation of such information and data should be left to medically-trained personnel. Information provided to patient can be located elsewhere in the medical record under "Patient Instructions". Document created using STT-dictation technology, any transcriptional errors that may result from process are unintentional.  ?  ?Patient: Leslie Orr  Service Category: Procedure  Provider: Edward Jolly, MD  ?DOB: 01-31-2001  DOS: 01/25/2022  Location: [facility]  ?MRN: [id number]  Setting: Ambulatory - outpatient  Referring Provider: Cyndia Diver, PA-C  ?Type: Established Patient  Specialty: Interventional Pain Management  PCP: Cyndia Diver, PA-C  ? ?Primary Reason for Visit: Interventional Pain Management Treatment. ?CC: Back Pain (lower) ? ?Procedure:          Anesthesia, Analgesia, Anxiolysis:  ?Type: Trigger Point Injection (2+muscle groups) Trapezius, Rhomboids, Latissimus Dorsi  #2  ?CPT: 20553 ?Primary Purpose: Therapeutic ?Periscapular region ?Laterality: Midline        Type: Local Anesthesia ?Indication(s): Analgesia         ?Local Anesthetic: Lidocaine 1-2% ?Route: Infiltration ([place]/IM) ?IV Access: Declined ?Sedation: Declined  ? ?Position: Prone  ? ?Indications: ?1. Chronic pain syndrome   ?2. Myofascial pain syndrome of lumbar spine   ?3. Neuropathic pain   ? ?Pain Score: ?Pre-procedure: 7 /10 ?Post-procedure: 0-No pain/10  ? ?Pre-op H&P Assessment:  ?Leslie Orr is a 21 y.o. (year old), female patient, seen today for interventional treatment. She  has a past surgical history that includes Other surgical history (Nov. 2001) and Back surgery (04/22/14 - 04/27/14). Leslie Orr has a current medication list which includes the following prescription(s): aripiprazole, bupropion, cetirizine, dicyclomine, duloxetine, esomeprazole, ferrous sulfate, fluticasone, gabapentin,  hydroxyzine, lisinopril, loperamide, methylphenidate, montelukast, multivitamin with minerals, nortriptyline, nortriptyline, ondansetron, ondansetron, promethazine, sumatriptan, tizanidine, and methylprednisolone. Her primarily concern today is the Back Pain (lower) ? ?Initial Vital Signs:  ?Pulse/HCG Rate: (!) 114  ?Temp: 98.1 ?F (36.7 ?C) ?Resp: 16 ?BP: 123/72 ?SpO2: 98 % ? ?BMI: Estimated body mass index is 30.9 kg/m? as calculated from the following: ?  Height as of this encounter: 5\' 4"  (1.626 m). ?  Weight as of this encounter: 180 lb (81.6 kg). ? ?Risk Assessment: ?Allergies: Reviewed. She is allergic to other, penicillins, and tilactase.  ?Allergy Precautions: None required ?Coagulopathies: Reviewed. None identified.  ?Blood-thinner therapy: None at this time ?Active Infection(s): Reviewed. None identified. Leslie Orr is afebrile ? ?Site Confirmation: Leslie Orr was asked to confirm the procedure and laterality before marking the site ?Procedure checklist: Completed ?Consent: Before the procedure and under the influence of no sedative(s), amnesic(s), or anxiolytics, the patient was informed of the treatment options, risks and possible complications. To fulfill our ethical and legal obligations, as recommended by the American Medical Association's Code of Ethics, I have informed the patient of my clinical impression; the nature and purpose of the treatment or procedure; the risks, benefits, and possible complications of the intervention; the alternatives, including doing nothing; the risk(s) and benefit(s) of the alternative treatment(s) or procedure(s); and the risk(s) and benefit(s) of doing nothing. ?The patient was provided information about the general risks and possible complications associated with the procedure. These may include, but are not limited to: failure to achieve desired goals, infection, bleeding, organ or nerve damage, allergic reactions, paralysis, and death. ?In addition, the patient was  informed of those risks and complications associated to the procedure, such as failure to decrease pain; infection; bleeding; organ or nerve damage with subsequent  damage to sensory, motor, and/or autonomic systems, resulting in permanent pain, numbness, and/or weakness of one or several areas of the body; allergic reactions; (i.e.: anaphylactic reaction); and/or death. ?Furthermore, the patient was informed of those risks and complications associated with the medications. These include, but are not limited to: allergic reactions (i.e.: anaphylactic or anaphylactoid reaction(s)); adrenal axis suppression; blood sugar elevation that in diabetics may result in ketoacidosis or comma; water retention that in patients with history of congestive heart failure may result in shortness of breath, pulmonary edema, and decompensation with resultant heart failure; weight gain; swelling or edema; medication-induced neural toxicity; particulate matter embolism and blood vessel occlusion with resultant organ, and/or nervous system infarction; and/or aseptic necrosis of one or more joints. ?Finally, the patient was informed that Medicine is not an exact science; therefore, there is also the possibility of unforeseen or unpredictable risks and/or possible complications that may result in a catastrophic outcome. The patient indicated having understood very clearly. We have given the patient no guarantees and we have made no promises. Enough time was given to the patient to ask questions, all of which were answered to the patient's satisfaction. Leslie Orr has indicated that she wanted to continue with the procedure. ?Attestation: I, the ordering provider, attest that I have discussed with the patient the benefits, risks, side-effects, alternatives, likelihood of achieving goals, and potential problems during recovery for the procedure that I have provided informed consent. ?Date  Time: 01/25/2022  9:43 AM ? ?Pre-Procedure  Preparation:  ?Monitoring: As per clinic protocol. Respiration, ETCO2, SpO2, BP, heart rate and rhythm monitor placed and checked for adequate function ?Safety Precautions: Patient was assessed for positional comfort and pressure points before starting the procedure. ?Time-out: I initiated and conducted the "Time-out" before starting the procedure, as per protocol. The patient was asked to participate by confirming the accuracy of the "Time Out" information. Verification of the correct person, site, and procedure were performed and confirmed by me, the nursing staff, and the patient. "Time-out" conducted as per Joint Commission's Universal Protocol (UP.01.01.01). ?Time: 1031 ? ?Description of Procedure:          ?Area Prepped: Entire periscapular region, trapezius region ?DuraPrep (Iodine Povacrylex [0.7% available iodine] and Isopropyl Alcohol, 74% w/w) ?Safety Precautions: Aspiration looking for blood return was conducted prior to all injections. At no point did we inject any substances, as a needle was being advanced. No attempts were made at seeking any paresthesias. Safe injection practices and needle disposal techniques used. Medications properly checked for expiration dates. SDV (single dose vial) medications used. ?Description of the Procedure: Protocol guidelines were followed. The patient was placed in position over the fluoroscopy table. The target area was identified and the area prepped in the usual manner. Skin & deeper tissues infiltrated with local anesthetic. Appropriate amount of time allowed to pass for local anesthetics to take effect. The procedure needles were then advanced to the target area. Proper needle placement secured. Negative aspiration confirmed. Solution injected in intermittent fashion, asking for systemic symptoms every 0.5cc of injectate. The needles were then removed and the area cleansed, making sure to leave some of the prepping solution back to take advantage of its long term  bactericidal properties. ? ?Vitals:  ? 01/25/22 1005  ?BP: 123/72  ?Pulse: (!) 114  ?Resp: 16  ?Temp: 98.1 ?F (36.7 ?C)  ?TempSrc: Temporal  ?SpO2: 98%  ?Weight: 180 lb (81.6 kg)  ?Height: 5\' 4"  (1.626 m)  ?  ?S

## 2022-01-25 NOTE — Progress Notes (Signed)
Safety precautions to be maintained throughout the outpatient stay will include: orient to surroundings, keep bed in low position, maintain call bell within reach at all times, provide assistance with transfer out of bed and ambulation.  

## 2022-01-26 ENCOUNTER — Ambulatory Visit (LOCAL_COMMUNITY_HEALTH_CENTER): Payer: Medicaid Other

## 2022-01-26 ENCOUNTER — Telehealth: Payer: Self-pay | Admitting: *Deleted

## 2022-01-26 VITALS — BP 125/87 | Ht 64.0 in | Wt 174.0 lb

## 2022-01-26 DIAGNOSIS — Z3042 Encounter for surveillance of injectable contraceptive: Secondary | ICD-10-CM

## 2022-01-26 DIAGNOSIS — Z309 Encounter for contraceptive management, unspecified: Secondary | ICD-10-CM | POA: Diagnosis not present

## 2022-01-26 DIAGNOSIS — Z3009 Encounter for other general counseling and advice on contraception: Secondary | ICD-10-CM

## 2022-01-26 MED ORDER — MEDROXYPROGESTERONE ACETATE 150 MG/ML IM SUSP
150.0000 mg | Freq: Once | INTRAMUSCULAR | Status: AC
  Administered 2022-01-26: 150 mg via INTRAMUSCULAR

## 2022-01-26 NOTE — Progress Notes (Signed)
11 weeks 6 days post depo.  Denies any problems with method.  Depo given IM RUOQ per order by Sadie Haber, PA, dated 03/02/22.  Tolerated well. ? ?Next depo due 04/13/22 and will need PE (last PE 03/02/21).  Pt given reminder card and told when scheduling appt to tell clerk.needs PE and depo.   ? ?Cherlynn Polo, RN  ?

## 2022-01-26 NOTE — Telephone Encounter (Signed)
Attempted to call for pre appointment review of allergies/meds. Message left. 

## 2022-01-27 ENCOUNTER — Other Ambulatory Visit: Payer: Self-pay

## 2022-02-16 ENCOUNTER — Telehealth: Payer: Self-pay

## 2022-02-16 ENCOUNTER — Ambulatory Visit
Payer: Medicaid Other | Attending: Student in an Organized Health Care Education/Training Program | Admitting: Student in an Organized Health Care Education/Training Program

## 2022-02-16 DIAGNOSIS — M792 Neuralgia and neuritis, unspecified: Secondary | ICD-10-CM

## 2022-02-16 DIAGNOSIS — G894 Chronic pain syndrome: Secondary | ICD-10-CM

## 2022-02-16 DIAGNOSIS — M7918 Myalgia, other site: Secondary | ICD-10-CM

## 2022-02-16 NOTE — Progress Notes (Signed)
?  I attempted to call the patient however no response. Voicemail left instructing patient to call front desk office at [phone] to reschedule appointment. ?-Dr Holley Raring ? ? ?Post-procedure evaluation  ? ?Type: Trigger Point Injection (2+muscle groups) Trapezius, Rhomboids, Latissimus Dorsi  #2  ?CPT: 20553 ?Primary Purpose: Therapeutic ?Periscapular region ?Laterality: Midline       ? ?Effectiveness:  ?Initial hour after procedure: 100 % ?Subsequent 4-6 hours post-procedure: 100 %  ?Analgesia past initial 6 hours: 100 % (lasting 1.5 weeks then slowly returned.)  ?Ongoing improvement:  ?Analgesic:  40-45% ?

## 2022-02-16 NOTE — Telephone Encounter (Signed)
LM for patient to call office for pre virtual appointment questions.  

## 2022-04-22 ENCOUNTER — Ambulatory Visit: Payer: Medicaid Other

## 2022-05-04 ENCOUNTER — Encounter: Payer: Self-pay | Admitting: Family Medicine

## 2022-05-04 ENCOUNTER — Ambulatory Visit: Payer: Medicaid Other

## 2022-05-04 ENCOUNTER — Ambulatory Visit (LOCAL_COMMUNITY_HEALTH_CENTER): Payer: Medicaid Other | Admitting: Family Medicine

## 2022-05-04 VITALS — BP 106/66 | Ht 64.0 in | Wt 168.2 lb

## 2022-05-04 DIAGNOSIS — Z309 Encounter for contraceptive management, unspecified: Secondary | ICD-10-CM | POA: Diagnosis not present

## 2022-05-04 DIAGNOSIS — Z30013 Encounter for initial prescription of injectable contraceptive: Secondary | ICD-10-CM

## 2022-05-04 DIAGNOSIS — Z3042 Encounter for surveillance of injectable contraceptive: Secondary | ICD-10-CM

## 2022-05-04 DIAGNOSIS — Z3009 Encounter for other general counseling and advice on contraception: Secondary | ICD-10-CM

## 2022-05-04 MED ORDER — MEDROXYPROGESTERONE ACETATE 150 MG/ML IM SUSP
150.0000 mg | INTRAMUSCULAR | Status: AC
  Administered 2022-05-04 – 2022-12-28 (×4): 150 mg via INTRAMUSCULAR

## 2022-05-04 NOTE — Progress Notes (Signed)
[facility] DEPARTMENT [facility] [address] Main Number: [phone]  Family Planning Visit- Repeat Yearly Visit  Subjective:  Leslie Orr is a 21 y.o. G0P0000  being seen today for an annual wellness visit and to discuss contraception options.   The patient is currently using Hormonal Injection for pregnancy prevention. Patient does not want a pregnancy in the next year.    report they are looking for a method that provides High efficacy at preventing pregnancy   Patient has the following medical problems: has Migraine without aura and without status migrainosus, not intractable; Tension headache; Idiopathic scoliosis; Episodic tension-type headache, not intractable; Neuropathic pain; Bruxism; Neuritis; Sleeping difficulty; Depression; Gastroesophageal reflux disease; ADHD; S/P lumbar spinal fusion; Intercostal neuralgia (right side); Chronic pain syndrome; Adjustment disorder; and Myofascial pain syndrome of lumbar spine on their problem list.  Chief Complaint  Patient presents with   Contraception    PE, Pap smear and Depo    Patient reports she is here for her Depo shot and physical.  She is concerned that her medicaid has been changed to FP only.  She has been in treatment for her headaches and back pain.  She is concerned she can't afford meds or her doctor appointments.    Patient denies other concerns  See flowsheet for other program required questions.   Body mass index is 28.87 kg/m. - Patient is eligible for diabetes screening based on BMI and age >106?  not applicable HA1C ordered? not applicable  Patient reports 1 of partners in last year. Desires STI screening?  No declines   Has patient been screened once for HCV in the past?  No  No results found for: "HCVAB"  Does the patient have current of drug use, have a partner with drug use, and/or has been incarcerated since last result? No  If yes-- Screen for HCV through  [facility]   Does the patient meet criteria for HBV testing? No  Criteria:  -Household, sexual or needle sharing contact with HBV -History of drug use -HIV positive -Those with known Hep C   Health Maintenance Due  Topic Date Due   COVID-19 Vaccine (1) Never done   CHLAMYDIA SCREENING  Never done   Hepatitis C Screening  Never done   TETANUS/TDAP  10/14/2020   PAP-Cervical Cytology Screening  Never done   PAP SMEAR-Modifier  Never done    Review of Systems  Neurological:  Positive for headaches (h/o of migraines).  All other systems reviewed and are negative.   The following portions of the patient's history were reviewed and updated as appropriate: allergies, current medications, past family history, past medical history, past social history, past surgical history and problem list. Problem list updated.  Objective:   Vitals:   05/04/22 1522  BP: 106/66  Weight: 168 lb 3.2 oz (76.3 kg)  Height: 5\' 4"  (1.626 m)    Physical Exam Constitutional:      Appearance: Normal appearance.  HENT:     Head: Normocephalic and atraumatic.  Pulmonary:     Effort: Pulmonary effort is normal.  Abdominal:     Palpations: Abdomen is soft.  Genitourinary:    Exam position: Lithotomy position.     Pubic Area: No rash or pubic lice.      Labia:        Right: No rash, tenderness or lesion.        Left: No rash, tenderness or lesion.  Vagina: Normal.     Cervix: No discharge, friability, lesion or erythema.     Comments: Bimanual not indicated Musculoskeletal:        General: Normal range of motion.  Lymphadenopathy:     Lower Body: No right inguinal adenopathy. No left inguinal adenopathy.  Skin:    General: Skin is warm and dry.  Neurological:     General: No focal deficit present.     Mental Status: She is alert.  Psychiatric:        Mood and Affect: Mood normal.        Behavior: Behavior normal.     Assessment and Plan:  Leslie Orr is a 21 y.o. female  G0P0000 presenting to the [facility] Department for an yearly wellness and contraception visit   Contraception counseling: Reviewed options based on patient desire and reproductive life plan. Patient is interested in Hormonal Injection. This was provided to the patient today.  Risks, benefits, and typical effectiveness rates were reviewed.  Questions were answered.  Written information was also given to the patient to review.    The patient will follow up in  11-13 weeks for surveillance.  The patient was told to call with any further questions, or with any concerns about this method of contraception.  Emphasized use of condoms 100% of the time for STI prevention.  Patient was assessed for need for ECP- she is using Depo for current birth control  1. Family planning  - Pap IG (Image Guided) Please give client [facility] information and PCP resource list.  Client states that she'll make contact for appointment.  2. Surveillance for Depo-Provera contraception  - medroxyPROGESTERone (DEPO-PROVERA) injection 150 mg x year.     Return in about 3 months (around 08/04/2022) for Depo.  Future Appointments  Date Time Provider Department Center  05/11/2022  3:20 PM Edward Jolly, MD [facility] None    Larene Pickett, FNP

## 2022-05-04 NOTE — Progress Notes (Signed)
Pt here for PE, Pap smear and Depo.   Depo 150 mg given IM in LUOQ without any complications.  Pt given reminder card to return in 11-13 weeks for next Depo injection.  Berdie Ogren, RN

## 2022-05-06 LAB — PAP IG (IMAGE GUIDED): PAP Smear Comment: 0

## 2022-05-10 ENCOUNTER — Telehealth: Payer: Self-pay

## 2022-05-10 NOTE — Telephone Encounter (Signed)
Patient no answer, left message to call back to review procedure from April.

## 2022-05-11 ENCOUNTER — Encounter: Payer: Self-pay | Admitting: Student in an Organized Health Care Education/Training Program

## 2022-05-11 ENCOUNTER — Ambulatory Visit
Payer: Medicaid Other | Attending: Student in an Organized Health Care Education/Training Program | Admitting: Student in an Organized Health Care Education/Training Program

## 2022-05-11 DIAGNOSIS — G588 Other specified mononeuropathies: Secondary | ICD-10-CM | POA: Diagnosis not present

## 2022-05-11 DIAGNOSIS — M7918 Myalgia, other site: Secondary | ICD-10-CM

## 2022-05-11 DIAGNOSIS — G894 Chronic pain syndrome: Secondary | ICD-10-CM | POA: Diagnosis not present

## 2022-05-11 DIAGNOSIS — Z981 Arthrodesis status: Secondary | ICD-10-CM

## 2022-05-11 DIAGNOSIS — M41114 Juvenile idiopathic scoliosis, thoracic region: Secondary | ICD-10-CM

## 2022-05-11 NOTE — Progress Notes (Signed)
Patient: Leslie Orr  Service Category: E/M  Provider: Gillis Santa, MD  DOB: 24-Mar-2001  DOS: 05/11/2022  Location: Office  MRN: [id number]  Setting: Ambulatory outpatient  Referring Provider: Dionicia Abler, PA-C  Type: Established Patient  Specialty: Interventional Pain Management  PCP: Dionicia Abler, PA-C  Location: Remote location  Delivery: TeleHealth     Virtual Encounter - Pain Management PROVIDER NOTE: Information contained herein reflects review and annotations entered in association with encounter. Interpretation of such information and data should be left to medically-trained personnel. Information provided to patient can be located elsewhere in the medical record under "Patient Instructions". Document created using STT-dictation technology, any transcriptional errors that may result from process are unintentional.    Contact & Pharmacy Preferred: [phone] Home: [phone] (home) Mobile: [phone] (mobile) E-mail: [email or web address]  [email or web address]: [phone]: [phone]  Pre-screening  Ms. Hyndman offered "in-person" vs "virtual" encounter. She indicated preferring virtual for this encounter.   Reason COVID-19*  Social distancing based on CDC and AMA recommendations.   I contacted LTheressa Stampson 05/11/2022 via telephone.      I clearly identified myself as BGillis Santa MD. I verified that I was speaking with the correct person using two identifiers (Name: LLudmilla Mcgillis and date of birth: 102-11-2000.  Consent I sought verbal advanced consent from LTheressa Stampsfor virtual visit interactions. I informed Ms. Distler of possible security and privacy concerns, risks, and limitations associated with providing "not-in-person" medical evaluation and management services. I also informed Ms. Wixom of the availability of "in-person" appointments.  Finally, I informed her that there would be a charge for the virtual visit and that she could be  personally, fully or partially, financially responsible for it. Ms. WLaflamexpressed understanding and agreed to proceed.   Historic Elements   Ms. Akisha NSalaya Holtropis a 21y.o. year old, female patient evaluated today after our last contact on 02/16/2022. Ms. WCarton has a past medical history of Acid reflux, ADHD, Depression, Hypertension, Migraines, Nausea, Neuralgia, Scoliosis, and Seasonal allergies. She also  has a past surgical history that includes Other surgical history (Nov. 2001) and Back surgery (04/22/14 - 04/27/14). Ms. WAguinaldohas a current medication list which includes the following prescription(s): aripiprazole, bupropion, cetirizine, dicyclomine, duloxetine, esomeprazole, ferrous sulfate, fluticasone, gabapentin, hydroxyzine, lisinopril, loperamide, methylphenidate, methylprednisolone, montelukast, multivitamin with minerals, nortriptyline, nortriptyline, ondansetron, promethazine, sumatriptan, and ondansetron, and the following Facility-Administered Medications: medroxyprogesterone. She  reports that she quit smoking about 12 months ago. Her smoking use included cigarettes. She has a 0.30 pack-year smoking history. She has never used smokeless tobacco. She reports that she does not currently use alcohol. She reports that she does not currently use drugs after having used the following drugs: Marijuana. Ms. WIlaganis allergic to other, penicillins, and tilactase.   HPI  Today, she is being contacted for a post-procedure assessment.   Post-procedure evaluation   Type: Trigger Point Injection (2+muscle groups) Trapezius, Rhomboids, Latissimus Dorsi  #2  CPT: [id number] Purpose: Therapeutic Periscapular region Laterality: Midline       Effectiveness:  Initial hour after procedure: 100 % . Subsequent 4-6 hours post-procedure: 100 %  Analgesia past initial 6 hours: 100 % (2 weeks)   Ongoing improvement:  Analgesic:  <25%    Laboratory Chemistry Profile   Renal Lab Results  Component Value Date   BUN 12 11/06/2020   CREATININE 0.73  21/97/5883   BCR NOT APPLICABLE 25/49/8264   GFRAA >60 03/03/2020   GFRNONAA >60 11/06/2020    Hepatic Lab Results  Component Value Date   AST 29 11/06/2020   ALT 38 11/06/2020   ALBUMIN 5.0 11/06/2020   ALKPHOS 74 11/06/2020   LIPASE 22 11/06/2020    Electrolytes Lab Results  Component Value Date   NA 138 11/06/2020   K 4.5 11/06/2020   CL 101 11/06/2020   CALCIUM 10.3 11/06/2020   MG 2.0 08/03/2017    Bone Lab Results  Component Value Date   VD25OH 27 (L) 08/03/2017    Inflammation (CRP: Acute Phase) (ESR: Chronic Phase) Lab Results  Component Value Date   ESRSEDRATE 4 02/27/2015         Note: Above Lab results reviewed.  Imaging  MR BRAIN W WO CONTRAST CLINICAL DATA:  Migraine without aura and without status migrainosus, not intractable G43.009 (ICD-10-CM). Additional history provided by scanning technologist: Patient reports a history of migraines beginning around age 72, new recent daily headaches.  EXAM: MRI HEAD WITHOUT AND WITH CONTRAST  TECHNIQUE: Multiplanar, multiecho pulse sequences of the brain and surrounding structures were obtained without and with intravenous contrast.  CONTRAST:  7.14m GADAVIST GADOBUTROL 1 MMOL/ML IV SOLN  COMPARISON:  No pertinent prior exams available for comparison.  FINDINGS: Brain:  Cerebral volume is normal.  No cortical encephalomalacia is identified. No significant cerebral white matter disease.  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  There is no acute infarct.  No evidence of an intracranial mass.  No chronic intracranial blood products.  No extra-axial fluid collection.  No midline shift.  No pathologic intracranial enhancement.  Vascular: Expected proximal arterial flow voids.  Skull and upper  cervical spine: No focal marrow lesion.  Sinuses/Orbits: Visualized orbits show no acute finding. No significant paranasal sinus disease.  IMPRESSION: No evidence of acute intracranial abnormality.  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 However, given the patient's young age, 1 year brain MRI follow-up is recommended to ensure size stability.  Otherwise unremarkable MRI appearance of the brain.  Electronically Signed   By: KKellie SimmeringDO   On: 05/18/2021 18:55  Assessment  The primary encounter diagnosis was Chronic pain syndrome. Diagnoses of Myofascial pain syndrome of lumbar spine, Intercostal neuralgia (right side), S/P lumbar spinal fusion, and Juvenile idiopathic scoliosis of thoracic region were also pertinent to this visit.  Plan of Care  Repeat TPI Orders:  Orders Placed This Encounter  Procedures   TRIGGER POINT INJECTION    Standing Status:   Future    Standing Expiration Date:   08/11/2022    Scheduling Instructions:     Cervical, thoracic, lumbar    Order Specific Question:   Where will this procedure be performed?    Answer:   [facility]   Follow-up plan:   Return in about 6 days (around 05/17/2022) for Lumbar TPI Monday 10 am, in clinic NS.     Right T9, T10, T11 intercostal nerve block 11/12/2020.  Trapezius, rhomboid, latissimus dorsi TPI 01/28/2021, helpful    Recent Visits No visits were found meeting these conditions. Showing recent visits within past 90 days and meeting all other requirements Today's Visits Date Type Provider Dept  05/11/22 Office Visit LGillis Santa MD [facility]  Showing today's visits and meeting all other requirements Future Appointments No visits were found meeting these conditions. Showing future appointments within next 90  days and meeting all other requirements  I discussed the assessment and treatment plan with the patient. The patient was provided an opportunity to ask questions and all  were answered. The patient agreed with the plan and demonstrated an understanding of the instructions.  Patient advised to call back or seek an in-person evaluation if the symptoms or condition worsens.  Duration of encounter: 49mnutes.  Note by: BGillis Santa MD Date: 05/11/2022; Time: 2:11 PM

## 2022-05-17 ENCOUNTER — Telehealth: Payer: Self-pay | Admitting: Student in an Organized Health Care Education/Training Program

## 2022-05-17 ENCOUNTER — Encounter: Payer: Self-pay | Admitting: Student in an Organized Health Care Education/Training Program

## 2022-05-17 ENCOUNTER — Ambulatory Visit
Payer: Medicaid Other | Attending: Student in an Organized Health Care Education/Training Program | Admitting: Student in an Organized Health Care Education/Training Program

## 2022-05-17 VITALS — BP 113/53 | HR 105 | Temp 98.1°F | Resp 18 | Ht 64.0 in | Wt 167.0 lb

## 2022-05-17 DIAGNOSIS — M7918 Myalgia, other site: Secondary | ICD-10-CM

## 2022-05-17 DIAGNOSIS — Z981 Arthrodesis status: Secondary | ICD-10-CM | POA: Diagnosis present

## 2022-05-17 DIAGNOSIS — M41114 Juvenile idiopathic scoliosis, thoracic region: Secondary | ICD-10-CM | POA: Insufficient documentation

## 2022-05-17 MED ORDER — ROPIVACAINE HCL 2 MG/ML IJ SOLN
9.0000 mL | Freq: Once | INTRAMUSCULAR | Status: AC
Start: 2022-05-17 — End: 2022-05-17
  Administered 2022-05-17: 9 mL via PERINEURAL

## 2022-05-17 MED ORDER — TIZANIDINE HCL 4 MG PO TABS: 2.0000 mg | ORAL_TABLET | Freq: Two times a day (BID) | ORAL | 1 refills | Status: AC | PRN

## 2022-05-17 MED ORDER — ROPIVACAINE HCL 2 MG/ML IJ SOLN
9.0000 mL | Freq: Once | INTRAMUSCULAR | Status: AC
  Administered 2022-05-17: 9 mL via PERINEURAL

## 2022-05-17 MED ORDER — ROPIVACAINE HCL 2 MG/ML IJ SOLN
INTRAMUSCULAR | Status: AC
  Filled 2022-05-17: qty 20

## 2022-05-17 NOTE — Telephone Encounter (Signed)
Patient wants to know if Dr. Cherylann Ratel can prescribe her some muscle realxers. Please give patient a call. Thanks

## 2022-05-17 NOTE — Progress Notes (Signed)
Safety precautions to be maintained throughout the outpatient stay will include: orient to surroundings, keep bed in low position, maintain call bell within reach at all times, provide assistance with transfer out of bed and ambulation.  

## 2022-05-17 NOTE — Progress Notes (Signed)
PROVIDER NOTE: Information contained herein reflects review and annotations entered in association with encounter. Interpretation of such information and data should be left to medically-trained personnel. Information provided to patient can be located elsewhere in the medical record under "Patient Instructions". Document created using STT-dictation technology, any transcriptional errors that may result from process are unintentional.    Patient: Leslie Orr  Service Category: Procedure  Provider: Edward Jolly, MD  DOB: Aug 01, 2001  DOS: 05/17/2022  Location: [facility]  MRN: [id number]  Setting: Ambulatory - outpatient  Referring Provider: Cyndia Diver, PA-C  Type: Established Patient  Specialty: Interventional Pain Management  PCP: Cyndia Diver, PA-C   Primary Reason for Visit: Interventional Pain Management Treatment. CC: Back Pain  Procedure:          Anesthesia, Analgesia, Anxiolysis:  Type: Trigger Point Injection (2+muscle groups) Trapezius, Rhomboids, Latissimus Dorsi, Lumbar paraspinals  #3  CPT: 20553 Primary Purpose: Therapeutic Periscapular region Laterality: Midline        Type: Local Anesthesia Indication(s): Analgesia         Local Anesthetic: Lidocaine 1-2% Route: Infiltration (Garner/IM) IV Access: Declined Sedation: Declined   Position: Prone   Indications: 1. Myofascial pain syndrome of lumbar spine   2. S/P lumbar spinal fusion   3. Juvenile idiopathic scoliosis of thoracic region    Pain Score: Pre-procedure: 5 /10 Post-procedure: 3 /10   Pre-op H&P Assessment:  Leslie Orr is a 21 y.o. (year old), female patient, seen today for interventional treatment. She  has a past surgical history that includes Other surgical history (Nov. 2001) and Back surgery (04/22/14 - 04/27/14). Leslie Orr has a current medication list which includes the following prescription(s): aripiprazole, bupropion, cetirizine, dicyclomine, esomeprazole, ferrous  sulfate, fluticasone, gabapentin, hydroxyzine, lisinopril, methylphenidate, montelukast, multivitamin with minerals, nortriptyline, nortriptyline, ondansetron, promethazine, sumatriptan, duloxetine, loperamide, methylprednisolone, and ondansetron, and the following Facility-Administered Medications: medroxyprogesterone. Her primarily concern today is the Back Pain  Initial Vital Signs:  Pulse/HCG Rate: (!) 117  Temp: 98.1 F (36.7 C) Resp: 16 BP: 131/62 SpO2: 100 %  BMI: Estimated body mass index is 28.67 kg/m as calculated from the following:   Height as of this encounter: 5\' 4"  (1.626 m).   Weight as of this encounter: 167 lb (75.8 kg).  Risk Assessment: Allergies: Reviewed. She is allergic to other, penicillins, and tilactase.  Allergy Precautions: None required Coagulopathies: Reviewed. None identified.  Blood-thinner therapy: None at this time Active Infection(s): Reviewed. None identified. Leslie Orr is afebrile  Site Confirmation: Leslie Orr was asked to confirm the procedure and laterality before marking the site Procedure checklist: Completed Consent: Before the procedure and under the influence of no sedative(s), amnesic(s), or anxiolytics, the patient was informed of the treatment options, risks and possible complications. To fulfill our ethical and legal obligations, as recommended by the American Medical Association's Code of Ethics, I have informed the patient of my clinical impression; the nature and purpose of the treatment or procedure; the risks, benefits, and possible complications of the intervention; the alternatives, including doing nothing; the risk(s) and benefit(s) of the alternative treatment(s) or procedure(s); and the risk(s) and benefit(s) of doing nothing. The patient was provided information about the general risks and possible complications associated with the procedure. These may include, but are not limited to: failure to achieve desired goals, infection,  bleeding, organ or nerve damage, allergic reactions, paralysis, and death. In addition, the patient was informed of those risks and complications associated to the procedure, such as failure to  decrease pain; infection; bleeding; organ or nerve damage with subsequent damage to sensory, motor, and/or autonomic systems, resulting in permanent pain, numbness, and/or weakness of one or several areas of the body; allergic reactions; (i.e.: anaphylactic reaction); and/or death. Furthermore, the patient was informed of those risks and complications associated with the medications. These include, but are not limited to: allergic reactions (i.e.: anaphylactic or anaphylactoid reaction(s)); adrenal axis suppression; blood sugar elevation that in diabetics may result in ketoacidosis or comma; water retention that in patients with history of congestive heart failure may result in shortness of breath, pulmonary edema, and decompensation with resultant heart failure; weight gain; swelling or edema; medication-induced neural toxicity; particulate matter embolism and blood vessel occlusion with resultant organ, and/or nervous system infarction; and/or aseptic necrosis of one or more joints. Finally, the patient was informed that Medicine is not an exact science; therefore, there is also the possibility of unforeseen or unpredictable risks and/or possible complications that may result in a catastrophic outcome. The patient indicated having understood very clearly. We have given the patient no guarantees and we have made no promises. Enough time was given to the patient to ask questions, all of which were answered to the patient's satisfaction. Leslie Orr has indicated that she wanted to continue with the procedure. Attestation: I, the ordering provider, attest that I have discussed with the patient the benefits, risks, side-effects, alternatives, likelihood of achieving goals, and potential problems during recovery for the  procedure that I have provided informed consent. Date  Time: 05/17/2022  9:59 AM  Pre-Procedure Preparation:  Monitoring: As per clinic protocol. Respiration, ETCO2, SpO2, BP, heart rate and rhythm monitor placed and checked for adequate function Safety Precautions: Patient was assessed for positional comfort and pressure points before starting the procedure. Time-out: I initiated and conducted the "Time-out" before starting the procedure, as per protocol. The patient was asked to participate by confirming the accuracy of the "Time Out" information. Verification of the correct person, site, and procedure were performed and confirmed by me, the nursing staff, and the patient. "Time-out" conducted as per Joint Commission's Universal Protocol (UP.01.01.01). Time: 1019  Description of Procedure:          Area Prepped: Entire periscapular region, trapezius region DuraPrep (Iodine Povacrylex [0.7% available iodine] and Isopropyl Alcohol, 74% w/w) Safety Precautions: Aspiration looking for blood return was conducted prior to all injections. At no point did we inject any substances, as a needle was being advanced. No attempts were made at seeking any paresthesias. Safe injection practices and needle disposal techniques used. Medications properly checked for expiration dates. SDV (single dose vial) medications used. Description of the Procedure: Protocol guidelines were followed. The patient was placed in position over the fluoroscopy table. The target area was identified and the area prepped in the usual manner. Skin & deeper tissues infiltrated with local anesthetic. Appropriate amount of time allowed to pass for local anesthetics to take effect. The procedure needles were then advanced to the target area. Proper needle placement secured. Negative aspiration confirmed. Solution injected in intermittent fashion, asking for systemic symptoms every 0.5cc of injectate. The needles were then removed and the area  cleansed, making sure to leave some of the prepping solution back to take advantage of its long term bactericidal properties.  Vitals:   05/17/22 1005 05/17/22 1018 05/17/22 1023 05/17/22 1027  BP: 131/62 (!) 129/58 (!) 126/59 (!) 113/53  Pulse: (!) 117 (!) 110 (!) 101 (!) 105  Resp: 16 20 18 18   Temp:  98.1 F (36.7 C)     TempSrc: Temporal     SpO2: 100% 100% 100% 97%  Weight: 167 lb (75.8 kg)     Height: 5\' 4"  (1.626 m)       Start Time: 1019 hrs. End Time: 1027 hrs. Materials:  Needle(s) Type: Regular needle Gauge: 22G Length: 3.5-in Medication(s): Please see orders for medications and dosing details. Approximately 20 triggerpoints injected with 0.5 to 1 cc of 0.2% ropivacaine  Dry needling was also performed in these regions.  Post-operative Assessment:  Post-procedure Vital Signs:  Pulse/HCG Rate: (!) 105  Temp: 98.1 F (36.7 C) Resp: 18 BP: (!) 113/53 SpO2: 97 %  EBL: None  Complications: No immediate post-treatment complications observed by team, or reported by patient.  Note: The patient tolerated the entire procedure well. A repeat set of vitals were taken after the procedure and the patient was kept under observation following institutional policy, for this type of procedure. Post-procedural neurological assessment was performed, showing return to baseline, prior to discharge. The patient was provided with post-procedure discharge instructions, including a section on how to identify potential problems. Should any problems arise concerning this procedure, the patient was given instructions to immediately contact , at any time, without hesitation. In any case, we plan to contact the patient by telephone for a follow-up status report regarding this interventional procedure.  Comments:  No additional relevant information.  Plan of Care   Medications ordered for procedure: Meds ordered this encounter  Medications   ropivacaine (PF) 2 mg/mL (0.2%) (NAROPIN)  injection 9 mL   ropivacaine (PF) 2 mg/mL (0.2%) (NAROPIN) injection 9 mL   Medications administered: We administered ropivacaine (PF) 2 mg/mL (0.2%) and ropivacaine (PF) 2 mg/mL (0.2%).  See the medical record for exact dosing, route, and time of administration.  Follow-up plan:   No follow-ups on file.     Recent Visits Date Type Provider Dept  05/11/22 Office Visit 05/13/22, MD [facility]  Showing recent visits within past 90 days and meeting all other requirements Today's Visits Date Type Provider Dept  05/17/22 Procedure visit 05/19/22, MD [facility]  Showing today's visits and meeting all other requirements Future Appointments Date Type Provider Dept  06/23/22 Appointment 08/23/22, MD [facility]  Showing future appointments within next 90 days and meeting all other requirements  Disposition: Discharge home  Discharge (Date  Time): 05/17/2022; 1040 hrs.   Primary Care Physician: 05/19/2022, PA-C Location: [facility] Note by: [facility], MD Date: 05/17/2022; Time: 10:41 AM  Disclaimer:  Medicine is not an exact science. The only guarantee in medicine is that nothing is guaranteed. It is important to note that the decision to proceed with this intervention was based on the information collected from the patient. The Data and conclusions were drawn from the patient's questionnaire, the interview, and the physical examination. Because the information was provided in large part by the patient, it cannot be guaranteed that it has not been purposely or unconsciously manipulated. Every effort has been made to obtain as much relevant data as possible for this evaluation. It is important to note that the conclusions that lead to this procedure are derived in large part from the available data. Always take into account that the treatment will also be dependent on availability of resources and existing  treatment guidelines, considered by other Pain Management Practitioners as being common knowledge and practice, at the time of the intervention. For Medico-Legal  purposes, it is also important to point out that variation in procedural techniques and pharmacological choices are the acceptable norm. The indications, contraindications, technique, and results of the above procedure should only be interpreted and judged by a Board-Certified Interventional Pain Specialist with extensive familiarity and expertise in the same exact procedure and technique.

## 2022-05-17 NOTE — Patient Instructions (Signed)
Pain Management Discharge Instructions  General Discharge Instructions :  If you need to reach your doctor call: Monday-Friday 8:00 am - 4:00 pm at [phone] or toll free [phone].  After clinic hours [phone] to have operator reach doctor.  Bring all of your medication bottles to all your appointments in the pain clinic.  To cancel or reschedule your appointment with Pain Management please remember to call 24 hours in advance to avoid a fee.  Refer to the educational materials which you have been given on: General Risks, I had my Procedure. Discharge Instructions, Post Sedation.  Post Procedure Instructions:  The drugs you were given will stay in your system until tomorrow, so for the next 24 hours you should not drive, make any legal decisions or drink any alcoholic beverages.  You may eat anything you prefer, but it is better to start with liquids then soups and crackers, and gradually work up to solid foods.  Please notify your doctor immediately if you have any unusual bleeding, trouble breathing or pain that is not related to your normal pain.  Depending on the type of procedure that was done, some parts of your body may feel week and/or numb.  This usually clears up by tonight or the next day.  Walk with the use of an assistive device or accompanied by an adult for the 24 hours.  You may use ice on the affected area for the first 24 hours.  Put ice in a Ziploc bag and cover with a towel and place against area 15 minutes on 15 minutes off.  You may switch to heat after 24 hours.Trigger Point Injection Trigger points are areas where you have pain. A trigger point injection is a shot given in the trigger point to help relieve pain for a few days to a few months. Common places for trigger points include the neck, shoulders, upper back, or lower back. A trigger point injection will not cure long-term (chronic) pain permanently. These injections do not always work for every  person. For some people, they can help to relieve pain for a few days to a few months. Tell a health care provider about: Any allergies you have. All medicines you are taking, including vitamins, herbs, eye drops, creams, and over-the-counter medicines. Any problems you or family members have had with anesthetic medicines. Any bleeding problems you have. Any surgeries you have had. Any medical conditions you have. Whether you are pregnant or may be pregnant. What are the risks? Generally, this is a safe procedure. However, problems may occur, including: Infection. Bleeding or bruising. Allergic reaction to the injected medicine. Irritation of the skin around the injection site. What happens before the procedure? Ask your health care provider about: Changing or stopping your regular medicines. This is especially important if you are taking diabetes medicines or blood thinners. Taking medicines such as aspirin and ibuprofen. These medicines can thin your blood. Do not take these medicines unless your health care provider tells you to take them. Taking over-the-counter medicines, vitamins, herbs, and supplements. What happens during the procedure?  Your health care provider will feel for trigger points. A marker may be used to circle the area for the injection. The skin over the trigger point will be washed with a germ-killing soap. You may be given a medicine to help you relax (sedative). A thin needle is used for the injection. You may feel pain or a twitching feeling when the needle enters your skin. A numbing solution may be injected   into the trigger point. Sometimes a medicine to keep down inflammation is also injected. Your health care provider may move the needle around the area where the trigger point is located until the tightness and twitching goes away. After the injection, your health care provider may put gentle pressure over the injection site. The injection site will be  covered with a bandage (dressing). The procedure may vary among health care providers and hospitals. What can I expect after treatment? After treatment, you may have soreness and stiffness for 1-2 days. Follow these instructions at home: Injection site care Remove your dressing in a few hours, or as told by your health care provider. Check your injection site every day for signs of infection. Check for: Redness, swelling, or pain. Fluid or blood. Warmth. Pus or a bad smell. Managing pain, stiffness, and swelling If directed, put ice on the affected area. To do this: Put ice in a plastic bag. Place a towel between your skin and the bag. Leave the ice on for 20 minutes, 2-3 times a day. Remove the ice if your skin turns bright red. This is very important. If you cannot feel pain, heat, or cold, you have a greater risk of damage to the area. Activity If you were given a sedative during the procedure, it can affect you for several hours. Do not drive or operate machinery until your health care provider says that it is safe. Do not take baths, swim, or use a hot tub until your health care provider approves. Return to your normal activities as told by your health care provider. Ask your health care provider what activities are safe for you. General instructions If you were asked to stop your regular medicines, ask your health care provider when you may start taking them again. You may be asked to see an occupational or physical therapist for exercises that reduce muscle strain and stretch the area of the trigger point. Keep all follow-up visits. This is important. Contact a health care provider if: Your pain comes back, and it is worse than before the injection. You may need more injections. You have chills or a fever. The injection site becomes more painful, red, swollen, or warm to the touch. Summary A trigger point injection is a shot given in the trigger point to help relieve  pain. Common places for trigger point injections are the neck, shoulders, upper back, and lower back. These injections do not always work for every person, but for some people, the injections can help to relieve pain for a few days to a few months. Contact a health care provider if symptoms come back or if they are worse than before treatment. Also, get help if the injection site becomes more painful, red, swollen, or warm to the touch. This information is not intended to replace advice given to you by your health care provider. Make sure you discuss any questions you have with your health care provider. Document Revised: 01/13/2021 Document Reviewed: 01/13/2021 Elsevier Patient Education  2023 [employer]  

## 2022-05-18 ENCOUNTER — Telehealth: Payer: Self-pay

## 2022-05-18 NOTE — Telephone Encounter (Signed)
Post procedure phone call. Patient states she is doing good.  

## 2022-06-22 ENCOUNTER — Telehealth: Payer: Self-pay

## 2022-06-22 NOTE — Telephone Encounter (Signed)
Attempted to call patient, phone number unable to connect.

## 2022-06-23 ENCOUNTER — Telehealth: Payer: Self-pay

## 2022-06-23 ENCOUNTER — Ambulatory Visit
Payer: Medicaid Other | Attending: Student in an Organized Health Care Education/Training Program | Admitting: Student in an Organized Health Care Education/Training Program

## 2022-06-23 DIAGNOSIS — M7918 Myalgia, other site: Secondary | ICD-10-CM

## 2022-06-23 NOTE — Progress Notes (Signed)
I attempted to call the patient however no response. Voicemail left instructing patient to call front desk office at [phone] to reschedule appointment. -Dr Avonelle Viveros  

## 2022-06-23 NOTE — Telephone Encounter (Signed)
Attempted 2nd time to call for pre virtual appointment questions.  Unable to leave message.

## 2022-06-23 NOTE — Telephone Encounter (Signed)
ATTEMPTED TO CALL TODAY AND PHONE NUMBER WOULD NOT GO THROUGH.

## 2022-07-20 ENCOUNTER — Ambulatory Visit (LOCAL_COMMUNITY_HEALTH_CENTER): Payer: BLUE CROSS/BLUE SHIELD

## 2022-07-20 VITALS — BP 120/69 | Ht 64.0 in | Wt 166.0 lb

## 2022-07-20 DIAGNOSIS — Z3009 Encounter for other general counseling and advice on contraception: Secondary | ICD-10-CM | POA: Diagnosis not present

## 2022-07-20 DIAGNOSIS — Z3042 Encounter for surveillance of injectable contraceptive: Secondary | ICD-10-CM

## 2022-07-20 NOTE — Progress Notes (Signed)
11 weeks 0 days post depo. Voices no concerns. Depo given today per order by Neville Route, FNP dated 05/04/2022. Tolerated well RUOQ. Next depo due 10/05/22, pt aware. Josie Saunders, RN

## 2022-08-09 NOTE — Progress Notes (Signed)
Late entry pap triage from July (08/09/22) NIL Next pap 3 year

## 2022-08-18 NOTE — Progress Notes (Signed)
Pap card mailed 

## 2022-10-08 ENCOUNTER — Ambulatory Visit (LOCAL_COMMUNITY_HEALTH_CENTER): Payer: BLUE CROSS/BLUE SHIELD

## 2022-10-08 VITALS — BP 113/58 | Ht 64.0 in | Wt 158.0 lb

## 2022-10-08 DIAGNOSIS — Z3009 Encounter for other general counseling and advice on contraception: Secondary | ICD-10-CM

## 2022-10-08 DIAGNOSIS — Z3042 Encounter for surveillance of injectable contraceptive: Secondary | ICD-10-CM | POA: Diagnosis not present

## 2022-10-08 DIAGNOSIS — Z309 Encounter for contraceptive management, unspecified: Secondary | ICD-10-CM

## 2022-10-08 NOTE — Progress Notes (Signed)
11 weeks 3 days post depo. Voices no concerns. Depo given today per order by Barrington Ellison, FNP dated 05/04/2022. Tolerated well LUOQ. Next depo due 12/24/2022, has reminder. Jerel Shepherd, RN

## 2022-12-21 DIAGNOSIS — Z23 Encounter for immunization: Secondary | ICD-10-CM | POA: Diagnosis not present

## 2022-12-21 DIAGNOSIS — K219 Gastro-esophageal reflux disease without esophagitis: Secondary | ICD-10-CM | POA: Diagnosis not present

## 2022-12-21 DIAGNOSIS — E348 Other specified endocrine disorders: Secondary | ICD-10-CM | POA: Diagnosis not present

## 2022-12-21 DIAGNOSIS — G894 Chronic pain syndrome: Secondary | ICD-10-CM | POA: Diagnosis not present

## 2022-12-28 ENCOUNTER — Encounter: Payer: Self-pay | Admitting: Student in an Organized Health Care Education/Training Program

## 2022-12-28 ENCOUNTER — Ambulatory Visit: Payer: 59

## 2022-12-28 ENCOUNTER — Ambulatory Visit
Admission: RE | Admit: 2022-12-28 | Discharge: 2022-12-28 | Disposition: A | Discharge status: Home / Self Care | Payer: Medicaid Other | Source: Ambulatory Visit | Attending: Student in an Organized Health Care Education/Training Program | Admitting: Student in an Organized Health Care Education/Training Program

## 2022-12-28 ENCOUNTER — Ambulatory Visit (HOSPITAL_BASED_OUTPATIENT_CLINIC_OR_DEPARTMENT_OTHER): Payer: Medicaid Other | Admitting: Student in an Organized Health Care Education/Training Program

## 2022-12-28 ENCOUNTER — Ambulatory Visit (LOCAL_COMMUNITY_HEALTH_CENTER): Payer: 59

## 2022-12-28 VITALS — BP 110/68 | Ht 64.0 in | Wt 162.0 lb

## 2022-12-28 VITALS — BP 124/70 | HR 82 | Temp 98.2°F | Resp 16 | Ht 64.0 in | Wt 162.0 lb

## 2022-12-28 DIAGNOSIS — M542 Cervicalgia: Secondary | ICD-10-CM | POA: Insufficient documentation

## 2022-12-28 DIAGNOSIS — Z3009 Encounter for other general counseling and advice on contraception: Secondary | ICD-10-CM

## 2022-12-28 DIAGNOSIS — M7918 Myalgia, other site: Secondary | ICD-10-CM | POA: Insufficient documentation

## 2022-12-28 DIAGNOSIS — Z981 Arthrodesis status: Secondary | ICD-10-CM | POA: Insufficient documentation

## 2022-12-28 DIAGNOSIS — G894 Chronic pain syndrome: Secondary | ICD-10-CM

## 2022-12-28 DIAGNOSIS — Z30013 Encounter for initial prescription of injectable contraceptive: Secondary | ICD-10-CM | POA: Diagnosis not present

## 2022-12-28 DIAGNOSIS — M41114 Juvenile idiopathic scoliosis, thoracic region: Secondary | ICD-10-CM | POA: Insufficient documentation

## 2022-12-28 DIAGNOSIS — Z309 Encounter for contraceptive management, unspecified: Secondary | ICD-10-CM

## 2022-12-28 DIAGNOSIS — Z3042 Encounter for surveillance of injectable contraceptive: Secondary | ICD-10-CM

## 2022-12-28 NOTE — Progress Notes (Signed)
PROVIDER NOTE: Information contained herein reflects review and annotations entered in association with encounter. Interpretation of such information and data should be left to medically-trained personnel. Information provided to patient can be located elsewhere in the medical record under "Patient Instructions". Document created using STT-dictation technology, any transcriptional errors that may result from process are unintentional.    Patient: Leslie Orr  Service Category: E/M  Provider: Gillis Santa, MD  DOB: 2001-01-25  DOS: 12/28/2022  Specialty: Interventional Pain Management  MRN: [id number]  Setting: Ambulatory outpatient  [facility]P: [facility]  Type: Established Patient    Referring Provider: Dionicia Abler, PA-C  Location: Office  Delivery: Face-to-face     HPI  Ms. Leslie Orr, a 22 y.o. year old female, is here today because of her Myofascial pain syndrome of lumbar spine [M79.18]. Ms. Masci primary complain today is Back Pain (lower) Last encounter: My last encounter with her was on 06/23/22 Pertinent problems: Ms. Mando has Migraine without aura and without status migrainosus, not intractable; Idiopathic scoliosis; Neuropathic pain; S/P lumbar spinal fusion; Intercostal neuralgia (right side); Chronic pain syndrome; and Myofascial pain syndrome of lumbar spine on their pertinent problem list. Pain Assessment: Severity of Chronic pain is reported as a 6 /10. Location: Back Lower, Left/left hip buttocks. Onset: More than a month ago. Quality: Aching, Tender. Timing: Constant. Modifying factor(s): streching, rest. Vitals:  height is '5\' 4"'$  (1.626 m) and weight is 162 lb (73.5 kg). Her temperature is 98.2 F (36.8 C). Her blood pressure is 124/70 and her pulse is 82. Her respiration is 16 and oxygen saturation is 100%.   Reason for encounter:  requesting repeat TPI .    Increased cervical spine pain/cervicalgia, often causes headaches Also increased right lower back  pain related to lumbar myofascial pain and trigger points  ROS  Constitutional: Denies any fever or chills Gastrointestinal: No reported hemesis, hematochezia, vomiting, or acute GI distress Musculoskeletal:  Myofascial pain-cervical and lumbar spine Neurological: No reported episodes of acute onset apraxia, aphasia, dysarthria, agnosia, amnesia, paralysis, loss of coordination, or loss of consciousness  Medication Review  ARIPiprazole, DULoxetine, SUMAtriptan, buPROPion, cetirizine, dicyclomine, esomeprazole, ferrous sulfate, fluticasone, gabapentin, hydrOXYzine, lisinopril, loperamide, methylPREDNISolone, methylphenidate, metoprolol succinate, montelukast, multivitamin with minerals, nortriptyline, ondansetron, and promethazine  History Review  Allergy: Ms. Santellano is allergic to other, penicillins, and tilactase. Drug: Ms. Adan  reports that she does not currently use drugs after having used the following drugs: Marijuana. Alcohol:  reports that she does not currently use alcohol. Tobacco:  reports that she quit smoking about 20 months ago. Her smoking use included cigarettes. She has a 0.30 pack-year smoking history. She has never used smokeless tobacco. Social: Ms. Detore  reports that she quit smoking about 20 months ago. Her smoking use included cigarettes. She has a 0.30 pack-year smoking history. She has never used smokeless tobacco. She reports that she does not currently use alcohol. She reports that she does not currently use drugs after having used the following drugs: Marijuana. Medical:  has a past medical history of Acid reflux, ADHD, Depression, Hypertension, Migraines, Nausea, Neuralgia, Scoliosis, and Seasonal allergies. Surgical: Ms. Rassi  has a past surgical history that includes Other surgical history (Nov. 2001) and Back surgery (04/22/14 - 04/27/14). Family: family history includes ADD / ADHD in her brother; Anxiety disorder in her mother; Arthritis in her maternal  grandmother; COPD in her paternal grandmother; Depression in her maternal grandmother and mother; Diabetes in her paternal grandmother; Drug abuse in her mother;  Emphysema in her paternal grandmother; Heart Problems in her maternal grandfather and mother; Hyperlipidemia in her maternal grandmother; Migraines in her father and maternal grandmother; Other in her father; Seizures in her father.  Laboratory Chemistry Profile   Renal Lab Results  Component Value Date   BUN 12 11/06/2020   CREATININE 0.73 123456   BCR NOT APPLICABLE 0000000   GFRAA >60 03/03/2020   GFRNONAA >60 11/06/2020    Hepatic Lab Results  Component Value Date   AST 29 11/06/2020   ALT 38 11/06/2020   ALBUMIN 5.0 11/06/2020   ALKPHOS 74 11/06/2020   LIPASE 22 11/06/2020    Electrolytes Lab Results  Component Value Date   NA 138 11/06/2020   K 4.5 11/06/2020   CL 101 11/06/2020   CALCIUM 10.3 11/06/2020   MG 2.0 08/03/2017    Bone Lab Results  Component Value Date   VD25OH 27 (L) 08/03/2017    Inflammation (CRP: Acute Phase) (ESR: Chronic Phase) Lab Results  Component Value Date   ESRSEDRATE 4 02/27/2015         Note: Above Lab results reviewed.  Recent Imaging Review  MR BRAIN W WO CONTRAST CLINICAL DATA:  Migraine without aura and without status migrainosus, not intractable G43.009 (ICD-10-CM). Additional history provided by scanning technologist: Patient reports a history of migraines beginning around age 70, new recent daily headaches.  EXAM: MRI HEAD WITHOUT AND WITH CONTRAST  TECHNIQUE: Multiplanar, multiecho pulse sequences of the brain and surrounding structures were obtained without and with intravenous contrast.  CONTRAST:  7.30m GADAVIST GADOBUTROL 1 MMOL/ML IV SOLN  COMPARISON:  No pertinent prior exams available for comparison.  FINDINGS: Brain:  Cerebral volume is normal.  No cortical encephalomalacia is identified. No significant cerebral white matter  disease.  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  There is no acute infarct.  No evidence of an intracranial mass.  No chronic intracranial blood products.  No extra-axial fluid collection.  No midline shift.  No pathologic intracranial enhancement.  Vascular: Expected proximal arterial flow voids.  Skull and upper cervical spine: No focal marrow lesion.  Sinuses/Orbits: Visualized orbits show no acute finding. No significant paranasal sinus disease.  IMPRESSION: No evidence of acute intracranial abnormality.  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 However, given the patient's young age, 1 year brain MRI follow-up is recommended to ensure size stability.  Otherwise unremarkable MRI appearance of the brain.  Electronically Signed   By: KKellie SimmeringDO   On: 05/18/2021 18:55 Note: Reviewed        Physical Exam  General appearance: Well nourished, well developed, and well hydrated. In no apparent acute distress Mental status: Alert, oriented x 3 (person, place, & time)       Respiratory: No evidence of acute respiratory distress Eyes: PERLA Vitals: BP 124/70   Pulse 82   Temp 98.2 F (36.8 C)   Resp 16   Ht '5\' 4"'$  (1.626 m)   Wt 162 lb (73.5 kg)   SpO2 100%   BMI 27.81 kg/m  BMI: Estimated body mass index is 27.81 kg/m as calculated from the following:   Height as of this encounter: '5\' 4"'$  (1.626 m).   Weight as of this encounter: 162 lb (73.5 kg). Ideal: Ideal body weight: 54.7 kg (120 lb 9.5 oz) Adjusted ideal body weight: 62.2 kg (137 lb 2.5 oz)  Cervicalgia and occipital pain Lower back pain right  Assessment  Status Diagnosis  Having a Flare-up Having a Flare-up Controlled 1. Myofascial pain syndrome of lumbar spine   2. Cervicalgia   3. S/P lumbar spinal fusion   4. Juvenile idiopathic scoliosis of thoracic region   5. Chronic pain syndrome       Updated Problems: Problem  Cervicalgia      Plan of Care    1. Myofascial pain syndrome of lumbar spine - TRIGGER POINT INJECTION; Future  2. Cervicalgia - DG Cervical Spine With Flex & Extend; Future - TRIGGER POINT INJECTION; Future  3. S/P lumbar spinal fusion  4. Juvenile idiopathic scoliosis of thoracic region  5. Chronic pain syndrome - DG Cervical Spine With Flex & Extend; Future - TRIGGER POINT INJECTION; Future    Orders:  Orders Placed This Encounter  Procedures   TRIGGER POINT INJECTION    Standing Status:   Future    Standing Expiration Date:   03/30/2023    Scheduling Instructions:     Cervical and right lumbar    Order Specific Question:   Where will this procedure be performed?    Answer:   [facility]   DG Cervical Spine With Flex & Extend    Patient presents with axial pain Please evaluate for any evidence of cervical spine instability. Describe the presence of any spondylolisthesis (Antero- or retrolisthesis). If present, provide displacement "Grade" and measurement in cm. Please describe presence and specific location (Level & Laterality) of any signs of  osteoarthritis, zygapophyseal (Facet) joints DJD (including decreased joint space and/or osteophytosis), DDD, Foraminal narrowing, as well as any sclerosis and/or cyst formation. Please comment on ROM. Patient presents with axial pain with possible radicular component. Please assist [place] in identifying specific level(s) and laterality of any additional findings such as: 1. Facet (Zygapophyseal) joint DJD (Hypertrophy, space narrowing, subchondral sclerosis, and/or osteophyte formation) 2. DDD and/or IVDD (Loss of disc height, desiccation, gas patterns, osteophytes, endplate sclerosis, or "Black disc disease") 3. Pars defects 4. Spondylolisthesis, spondylosis, and/or spondyloarthropathies (include Degree/Grade of displacement in mm) (stability) 5. Vertebral body Fractures (acute/chronic) (state percentage of collapse) 6. Demineralization  (osteopenia/osteoporotic) 7. Bone pathology 8. Foraminal narrowing  9. Surgical changes    Standing Status:   Future    Standing Expiration Date:   01/28/2023    Scheduling Instructions:     Please make sure that the patient understands that this needs to be done as soon as possible. Never have the patient do the imaging "just before the next appointment". Inform patient that having the imaging done within the [facility] will expedite the availability of the results and will provide      imaging availability to the requesting physician. In addition inform the patient that the imaging order has an expiration date and will not be renewed if not done within the active period.    Order Specific Question:   Reason for Exam (SYMPTOM  OR DIAGNOSIS REQUIRED)    Answer:   Cervicalgia    Order Specific Question:   Is patient pregnant?    Answer:   No    Order Specific Question:   Preferred imaging location?    Answer:   [facility]    Order Specific Question:   Call Results- Best Contact Number?    Answer:   [phone] ([facility])    Order Specific Question:   Radiology Contrast Protocol - do NOT remove file path    Answer:   [email or web address]    Order Specific Question:  Release to patient    Answer:   Immediate   Follow-up plan:   Return in about 2 weeks (around 01/11/2023) for Cervical and R lumbar TPI.     Right T9, T10, T11 intercostal nerve block 11/12/2020.  Trapezius, rhomboid, latissimus dorsi TPI 01/28/2021, helpful   Recent Visits No visits were found meeting these conditions. Showing recent visits within past 90 days and meeting all other requirements Today's Visits Date Type Provider Dept  12/28/22 Office Visit Gillis Santa, MD [facility]  Showing today's visits and meeting all other requirements Future Appointments No visits were found meeting these conditions. Showing future appointments within next 90 days  and meeting all other requirements  I discussed the assessment and treatment plan with the patient. The patient was provided an opportunity to ask questions and all were answered. The patient agreed with the plan and demonstrated an understanding of the instructions.  Patient advised to call back or seek an in-person evaluation if the symptoms or condition worsens.  Duration of encounter: 36mnutes.  Note by: BGillis Santa MD Date: 12/28/2022; Time: 1:14 PM

## 2022-12-28 NOTE — Progress Notes (Signed)
Safety precautions to be maintained throughout the outpatient stay will include: orient to surroundings, keep bed in low position, maintain call bell within reach at all times, provide assistance with transfer out of bed and ambulation.  

## 2022-12-28 NOTE — Progress Notes (Signed)
11 weeks 4 days post depo. Voices no concerns. Depo given today per order by Neville Route, FNP dated 05/04/2022. Tolerated well RUOQ. Next depo due 03/15/2023, has reminder. Josie Saunders, RN

## 2023-01-11 DIAGNOSIS — E348 Other specified endocrine disorders: Secondary | ICD-10-CM | POA: Diagnosis not present

## 2023-01-11 DIAGNOSIS — G930 Cerebral cysts: Secondary | ICD-10-CM | POA: Diagnosis not present

## 2023-01-24 ENCOUNTER — Encounter: Payer: Self-pay | Admitting: Student in an Organized Health Care Education/Training Program

## 2023-01-24 ENCOUNTER — Ambulatory Visit
Payer: 59 | Attending: Student in an Organized Health Care Education/Training Program | Admitting: Student in an Organized Health Care Education/Training Program

## 2023-01-24 VITALS — BP 122/61 | HR 96 | Temp 97.6°F | Resp 100 | Ht 64.0 in | Wt 161.0 lb

## 2023-01-24 DIAGNOSIS — Z981 Arthrodesis status: Secondary | ICD-10-CM | POA: Diagnosis not present

## 2023-01-24 DIAGNOSIS — M7918 Myalgia, other site: Secondary | ICD-10-CM | POA: Insufficient documentation

## 2023-01-24 MED ORDER — ROPIVACAINE HCL 2 MG/ML IJ SOLN
INTRAMUSCULAR | Status: AC
  Filled 2023-01-24: qty 20

## 2023-01-24 MED ORDER — ROPIVACAINE HCL 2 MG/ML IJ SOLN
9.0000 mL | Freq: Once | INTRAMUSCULAR | Status: AC
  Administered 2023-01-24: 9 mL via PERINEURAL

## 2023-01-24 MED ORDER — LIDOCAINE HCL 2 % IJ SOLN
INTRAMUSCULAR | Status: AC
  Filled 2023-01-24: qty 20

## 2023-01-24 NOTE — Progress Notes (Signed)
Safety precautions to be maintained throughout the outpatient stay will include: orient to surroundings, keep bed in low position, maintain call bell within reach at all times, provide assistance with transfer out of bed and ambulation.  

## 2023-01-24 NOTE — Patient Instructions (Signed)

## 2023-01-24 NOTE — Progress Notes (Signed)
PROVIDER NOTE: Interpretation of information contained herein should be left to medically-trained personnel. Specific patient instructions are provided elsewhere under "Patient Instructions" section of medical record. This document was created in part using STT-dictation technology, any transcriptional errors that may result from this process are unintentional.  Patient: Leslie Orr Type: Established DOB: [birth year] MRN: [id number] [facility]: [facility]  Service: Procedure DOS: 01/24/2023 Setting: Ambulatory Location: Ambulatory outpatient facility Delivery: Face-to-face Provider: Edward Jolly, MD Specialty: Interventional Pain Management Specialty designation: 09 Location: Outpatient facility Ref. Prov.: [facility]       Interventional Therapy   Primary Reason for Visit: Interventional Pain Management Treatment. CC: Back Pain    Procedure:            Type: Lumbar Trigger Point Injection 3+  CPT: 20553  Level Lumbar Target Area: lumbosacral Trigger Point Approach: Percutaneous, ipsilateral approach. Laterality: Bilateral           1. Myofascial pain syndrome of lumbar spine   2. S/P lumbar spinal fusion    NAS-11 Pain score:   Pre-procedure: 5 /10   Post-procedure: 0-No pain/10     Pre-op H&P Assessment:  Leslie Orr is a 22 y.o. (year old), female patient, seen today for interventional treatment. She  has a past surgical history that includes Other surgical history (Nov. 2001) and Back surgery (04/22/14 - 04/27/14). Leslie Orr has a current medication list which includes the following prescription(s): aripiprazole, bupropion, cetirizine, dicyclomine, duloxetine, esomeprazole, ferrous sulfate, fluticasone, gabapentin, hydroxyzine, lisinopril, loperamide, methylphenidate, methylprednisolone, metoprolol succinate, montelukast, multivitamin with minerals, nortriptyline, nortriptyline, ondansetron, ondansetron, promethazine, and sumatriptan. Her primarily concern today is  the Back Pain  Initial Vital Signs:  Pulse/HCG Rate: 96  Temp: 97.6 F (36.4 C) Resp: (!) 100 BP: 122/61 SpO2:    BMI: Estimated body mass index is 27.64 kg/m as calculated from the following:   Height as of this encounter: 5\' 4"  (1.626 m).   Weight as of this encounter: 161 lb (73 kg).  Risk Assessment: Allergies: Reviewed. She is allergic to other, penicillins, and tilactase.  Allergy Precautions: None required Coagulopathies: Reviewed. None identified.  Blood-thinner therapy: None at this time Active Infection(s): Reviewed. None identified. Leslie Orr is afebrile  Site Confirmation: Leslie Orr was asked to confirm the procedure and laterality before marking the site Procedure checklist: Completed Consent: Before the procedure and under the influence of no sedative(s), amnesic(s), or anxiolytics, the patient was informed of the treatment options, risks and possible complications. To fulfill our ethical and legal obligations, as recommended by the American Medical Association's Code of Ethics, I have informed the patient of my clinical impression; the nature and purpose of the treatment or procedure; the risks, benefits, and possible complications of the intervention; the alternatives, including doing nothing; the risk(s) and benefit(s) of the alternative treatment(s) or procedure(s); and the risk(s) and benefit(s) of doing nothing. The patient was provided information about the general risks and possible complications associated with the procedure. These may include, but are not limited to: failure to achieve desired goals, infection, bleeding, organ or nerve damage, allergic reactions, paralysis, and death. In addition, the patient was informed of those risks and complications associated to the procedure, such as failure to decrease pain; infection; bleeding; organ or nerve damage with subsequent damage to sensory, motor, and/or autonomic systems, resulting in permanent pain, numbness,  and/or weakness of one or several areas of the body; allergic reactions; (i.e.: anaphylactic reaction); and/or death. Furthermore, the patient was informed of those risks  and complications associated with the medications. These include, but are not limited to: allergic reactions (i.e.: anaphylactic or anaphylactoid reaction(s)); adrenal axis suppression; blood sugar elevation that in diabetics may result in ketoacidosis or comma; water retention that in patients with history of congestive heart failure may result in shortness of breath, pulmonary edema, and decompensation with resultant heart failure; weight gain; swelling or edema; medication-induced neural toxicity; particulate matter embolism and blood vessel occlusion with resultant organ, and/or nervous system infarction; and/or aseptic necrosis of one or more joints. Finally, the patient was informed that Medicine is not an exact science; therefore, there is also the possibility of unforeseen or unpredictable risks and/or possible complications that may result in a catastrophic outcome. The patient indicated having understood very clearly. We have given the patient no guarantees and we have made no promises. Enough time was given to the patient to ask questions, all of which were answered to the patient's satisfaction. Leslie Orr has indicated that she wanted to continue with the procedure. Attestation: I, the ordering provider, attest that I have discussed with the patient the benefits, risks, side-effects, alternatives, likelihood of achieving goals, and potential problems during recovery for the procedure that I have provided informed consent. Date  Time: 01/24/2023  1:23 PM  Pre-Procedure Preparation:  Monitoring: As per clinic protocol. Respiration, ETCO2, SpO2, BP, heart rate and rhythm monitor placed and checked for adequate function Safety Precautions: Patient was assessed for positional comfort and pressure points before starting the  procedure. Time-out: I initiated and conducted the "Time-out" before starting the procedure, as per protocol. The patient was asked to participate by confirming the accuracy of the "Time Out" information. Verification of the correct person, site, and procedure were performed and confirmed by me, the nursing staff, and the patient. "Time-out" conducted as per Joint Commission's Universal Protocol (UP.01.01.01). Time: 1353 Start Time: 1353 hrs.  Description of Procedure:          Area Prepped: Entire             Region DuraPrep (Iodine Povacrylex [0.7% available iodine] and Isopropyl Alcohol, 74% w/w) Safety Precautions: Aspiration looking for blood return was conducted prior to all injections. At no point did we inject any substances, as a needle was being advanced. No attempts were made at seeking any paresthesias. Safe injection practices and needle disposal techniques used. Medications properly checked for expiration dates. SDV (single dose vial) medications used. Description of the Procedure: Protocol guidelines were followed. The patient was placed in position over the fluoroscopy table. The target area was identified and the area prepped in the usual manner. Skin & deeper tissues infiltrated with local anesthetic. Appropriate amount of time allowed to pass for local anesthetics to take effect. The procedure needles were then advanced to the target area. Proper needle placement secured. Negative aspiration confirmed. Solution injected in intermittent fashion, asking for systemic symptoms every 0.5cc of injectate. The needles were then removed and the area cleansed, making sure to leave some of the prepping solution back to take advantage of its long term bactericidal properties.  Vitals:   01/24/23 1327  BP: 122/61  Pulse: 96  Resp: (!) 100  Temp: 97.6 F (36.4 C)  TempSrc: Temporal  Weight: 161 lb (73 kg)  Height: 5\' 4"  (1.626 m)    Start Time: 1353 hrs. End Time: 1357 hrs. Materials:    Approximately 10 trigger points injected in the lumbosacral region while patient sitting, nurse present in room.  2 cc of 0.2% ropivacaine  injected into each trigger point.   Post-operative Assessment:  Post-procedure Vital Signs:  Pulse/HCG Rate: 96  Temp: 97.6 F (36.4 C) Resp: (!) 100 BP: 122/61 SpO2:    EBL: None  Complications: No immediate post-treatment complications observed by team, or reported by patient.  Note: The patient tolerated the entire procedure well. A repeat set of vitals were taken after the procedure and the patient was kept under observation following institutional policy, for this type of procedure. Post-procedural neurological assessment was performed, showing return to baseline, prior to discharge. The patient was provided with post-procedure discharge instructions, including a section on how to identify potential problems. Should any problems arise concerning this procedure, the patient was given instructions to immediately contact us, at any time, without hesitation. In any case, we plan to contact the patient by telephone for a follow-up status report regarding this interventional procedure.  Comments:  No additional relevant information.  Plan of Care (POC)   Medications ordered for procedure: Meds ordered this encounter  Medications   ropivacaine (PF) 2 mg/mL (0.2%) (NAROPIN) injection 9 mL   ropivacaine (PF) 2 mg/mL (0.2%) (NAROPIN) injection 9 mL   Medications administered: We administered ropivacaine (PF) 2 mg/mL (0.2%) and ropivacaine (PF) 2 mg/mL (0.2%).  See the medical record for exact dosing, route, and time of administration.  Follow-up plan:   Return in about 6 weeks (around 03/07/2023) for VV PPE.       Recent Visits Date Type Provider Dept  12/28/22 Office Visit Edward JollyLateef, Marnie Fazzino, MD [facility]  Showing recent visits within past 90 days and meeting all other requirements Today's Visits Date Type Provider Dept  01/24/23  Procedure visit Edward JollyLateef, Theola Cuellar, MD [facility]  Showing today's visits and meeting all other requirements Future Appointments Date Type Provider Dept  03/07/23 Appointment Edward JollyLateef, Dinari Stgermaine, MD [facility]  Showing future appointments within next 90 days and meeting all other requirements  Disposition: Discharge home  Discharge (Date  Time): 01/24/2023;   hrs.   Primary Care Physician: [facility] Location: [facility] Note by: Edward JollyBilal Anmarie Fukushima, MD (TTS technology used. I apologize for any typographical errors that were not detected and corrected.) Date: 01/24/2023; Time: 3:30 PM  Disclaimer:  Medicine is not an Visual merchandiserexact science. The only guarantee in medicine is that nothing is guaranteed. It is important to note that the decision to proceed with this intervention was based on the information collected from the patient. The Data and conclusions were drawn from the patient's questionnaire, the interview, and the physical examination. Because the information was provided in large part by the patient, it cannot be guaranteed that it has not been purposely or unconsciously manipulated. Every effort has been made to obtain as much relevant data as possible for this evaluation. It is important to note that the conclusions that lead to this procedure are derived in large part from the available data. Always take into account that the treatment will also be dependent on availability of resources and existing treatment guidelines, considered by other Pain Management Practitioners as being common knowledge and practice, at the time of the intervention. For Medico-Legal purposes, it is also important to point out that variation in procedural techniques and pharmacological choices are the acceptable norm. The indications, contraindications, technique, and results of the above procedure should only be interpreted and judged by a Board-Certified Interventional Pain  Specialist with extensive familiarity and expertise in the same exact procedure and technique.

## 2023-03-07 ENCOUNTER — Ambulatory Visit
Payer: 59 | Attending: Student in an Organized Health Care Education/Training Program | Admitting: Student in an Organized Health Care Education/Training Program

## 2023-03-07 DIAGNOSIS — M41114 Juvenile idiopathic scoliosis, thoracic region: Secondary | ICD-10-CM

## 2023-03-07 DIAGNOSIS — Z981 Arthrodesis status: Secondary | ICD-10-CM

## 2023-03-07 DIAGNOSIS — M7918 Myalgia, other site: Secondary | ICD-10-CM | POA: Diagnosis not present

## 2023-03-07 DIAGNOSIS — M542 Cervicalgia: Secondary | ICD-10-CM | POA: Diagnosis not present

## 2023-03-07 DIAGNOSIS — G894 Chronic pain syndrome: Secondary | ICD-10-CM

## 2023-03-07 NOTE — Progress Notes (Signed)
Patient: Leslie Orr  Service Category: E/M  Provider: Edward Jolly, MD  DOB: Nov 22, 2000  DOS: 03/07/2023  Location: Office  MRN: [id number]  Setting: Ambulatory outpatient  Referring Provider: [facility]  Type: Established Patient  Specialty: Interventional Pain Management  PCP: [facility]  Location: Remote location  Delivery: TeleHealth     Virtual Encounter - Pain Management PROVIDER NOTE: Information contained herein reflects review and annotations entered in association with encounter. Interpretation of such information and data should be left to medically-trained personnel. Information provided to patient can be located elsewhere in the medical record under "Patient Instructions". Document created using STT-dictation technology, any transcriptional errors that may result from process are unintentional.    Contact & Pharmacy Preferred: [phone] Home: [phone] (home) Mobile: [phone] (mobile) E-mail: [email or web address]  [facility] [phone] Nich[place] Rough,  - [address] [place] [address] Phone: [phone] Fax: [phone]   Pre-screening  Ms. Tschida offered "in-person" vs "virtual" encounter. She indicated preferring virtual for this encounter.   Reason COVID-19*  Social distancing based on CDC and AMA recommendations.   I contacted [name] on 03/07/2023 via telephone.      I clearly identified myself as Edward Jolly, MD. I verified that I was speaking with the correct person using two identifiers (Name: Sandar Rodenbeck, and date of birth: [birth year]).  Consent I sought verbal advanced consent from [name] for virtual visit interactions. I informed Ms. Lasch of possible security and privacy concerns, risks, and limitations associated with providing "not-in-person" medical evaluation and management services. I also informed Ms. Skora of the availability of "in-person" appointments. Finally, I  informed her that there would be a charge for the virtual visit and that she could be  personally, fully or partially, financially responsible for it. Ms. Chisolm expressed understanding and agreed to proceed.   Historic Elements   Ms. Zayna Jaliayah Sahr is a 22 y.o. year old, female patient evaluated today after our last contact on 01/24/2023. Ms. Ard  has a past medical history of Acid reflux, ADHD, Depression, Hypertension, Migraines, Nausea, Neuralgia, Scoliosis, and Seasonal allergies. She also  has a past surgical history that includes Other surgical history (Nov. 2001) and Back surgery (04/22/14 - 04/27/14). Ms. Hruza has a current medication list which includes the following prescription(s): aripiprazole, bupropion, cetirizine, dicyclomine, duloxetine, esomeprazole, ferrous sulfate, fluticasone, gabapentin, hydroxyzine, lisinopril, loperamide, methylphenidate, methylprednisolone, metoprolol succinate, montelukast, multivitamin with minerals, nortriptyline, nortriptyline, ondansetron, ondansetron, promethazine, and sumatriptan. She  reports that she quit smoking about 22 months ago. Her smoking use included cigarettes. She has a 0.30 pack-year smoking history. She has never used smokeless tobacco. She reports that she does not currently use alcohol. She reports that she does not currently use drugs after having used the following drugs: Marijuana. Ms. Amoah is allergic to other, penicillins, and tilactase.  BMI: Estimated body mass index is 27.64 kg/m as calculated from the following:   Height as of 01/24/23: 5\' 4"  (1.626 m).   Weight as of 01/24/23: 161 lb (73 kg). Last encounter: 12/28/2022. Last procedure: 01/24/2023.  HPI  Today, she is being contacted for a post-procedure assessment.   Post-procedure evaluation    Procedure:            Type: Lumbar Trigger Point Injection 3+  CPT: 20553  Level Lumbar Target Area: lumbosacral Trigger Point Approach: Percutaneous, ipsilateral  approach. Laterality: Bilateral           1. Myofascial pain  syndrome of lumbar spine   2. S/P lumbar spinal fusion    NAS-11 Pain score:   Pre-procedure: 5 /10   Post-procedure: 0-No pain/10      Effectiveness:  Initial hour after procedure: 100 %  Subsequent 4-6 hours post-procedure: 100 %  Analgesia past initial 6 hours: 0 % (80% relief for 4 days and then pain returned)  Ongoing improvement:  Analgesic:  0% Function: Somewhat improved ROM: Somewhat improved   Laboratory Chemistry Profile   Renal Lab Results  Component Value Date   BUN 12 11/06/2020   CREATININE 0.73 11/06/2020   BCR NOT APPLICABLE 08/03/2017   GFRAA >60 03/03/2020   GFRNONAA >60 11/06/2020    Hepatic Lab Results  Component Value Date   AST 29 11/06/2020   ALT 38 11/06/2020   ALBUMIN 5.0 11/06/2020   ALKPHOS 74 11/06/2020   LIPASE 22 11/06/2020    Electrolytes Lab Results  Component Value Date   NA 138 11/06/2020   K 4.5 11/06/2020   CL 101 11/06/2020   CALCIUM 10.3 11/06/2020   MG 2.0 08/03/2017    Bone Lab Results  Component Value Date   VD25OH 27 (L) 08/03/2017    Inflammation (CRP: Acute Phase) (ESR: Chronic Phase) Lab Results  Component Value Date   ESRSEDRATE 4 02/27/2015         Note: Above Lab results reviewed.  Imaging  DG Cervical Spine With Flex & Extend CLINICAL DATA:  Cervicalgia  EXAM: CERVICAL SPINE COMPLETE WITH FLEXION AND EXTENSION VIEWS  COMPARISON:  None Available.  FINDINGS: Normal alignment and prevertebral soft tissues. Preserved vertebral body heights and disc spaces. Facets are aligned. No instability appreciated with flexion and extension. Foramina appear patent. Intact odontoid. Trachea midline. Lung apices are clear. Thoracic fusion hardware noted, incompletely imaged.  IMPRESSION: No acute finding by plain radiography.  Electronically Signed   By: Judie Petit.  Shick M.D.   On: 12/30/2022 15:31  Assessment  The primary encounter diagnosis was  Myofascial pain syndrome of lumbar spine. Diagnoses of S/P lumbar spinal fusion, Cervicalgia, Juvenile idiopathic scoliosis of thoracic region, and Chronic pain syndrome were also pertinent to this visit.  Plan of Care  Encourage patient to continue with lumbar stretching exercises that she has learned.  She can consider ibuprofen or naproxen as needed and also consider magnesium for muscle spasms and spasticity.  She endorsed understanding.  Follow-up as needed.   Follow-up plan:   Return for patient will call to schedule F2F appt prn.      Right T9, T10, T11 intercostal nerve block 11/12/2020.  Trapezius, rhomboid, latissimus dorsi TPI 01/28/2021, helpful     Recent Visits Date Type Provider Dept  01/24/23 Procedure visit Edward Jolly, MD [facility]  12/28/22 Office Visit Edward Jolly, MD [facility]  Showing recent visits within past 90 days and meeting all other requirements Today's Visits Date Type Provider Dept  03/07/23 Office Visit Edward Jolly, MD [facility]  Showing today's visits and meeting all other requirements Future Appointments No visits were found meeting these conditions. Showing future appointments within next 90 days and meeting all other requirements  I discussed the assessment and treatment plan with the patient. The patient was provided an opportunity to ask questions and all were answered. The patient agreed with the plan and demonstrated an understanding of the instructions.  Patient advised to call back or seek an in-person evaluation if the symptoms or condition worsens.  Duration of encounter: .  Note by:  Edward Jolly, MD Date: 03/07/2023; Time: 3:23 PM

## 2023-03-28 ENCOUNTER — Ambulatory Visit: Payer: 59

## 2023-03-28 VITALS — BP 107/66 | Ht 64.0 in | Wt 159.5 lb

## 2023-03-28 DIAGNOSIS — Z30013 Encounter for initial prescription of injectable contraceptive: Secondary | ICD-10-CM | POA: Diagnosis not present

## 2023-03-28 DIAGNOSIS — Z3009 Encounter for other general counseling and advice on contraception: Secondary | ICD-10-CM

## 2023-03-28 DIAGNOSIS — Z3042 Encounter for surveillance of injectable contraceptive: Secondary | ICD-10-CM

## 2023-03-28 MED ORDER — MEDROXYPROGESTERONE ACETATE 150 MG/ML IM SUSP
150.0000 mg | Freq: Once | INTRAMUSCULAR | Status: AC
  Administered 2023-03-28: 150 mg via INTRAMUSCULAR

## 2023-03-28 NOTE — Progress Notes (Signed)
12 weeks 6 days post depo.  Denies any complaints associated with depo.  Depo given IM RUOQ per order by C. Kizzie Ide FNP dated 05/04/22 x one year; tolerated well.  Annual PE due on or after 05/06/23 and depo due 06/13/23.  Appt reminder given.  Pt states she will make appt today to get PE when due.   Leslie Polo, RN

## 2023-04-15 ENCOUNTER — Telehealth: Payer: Self-pay | Admitting: Student in an Organized Health Care Education/Training Program

## 2023-04-15 NOTE — Telephone Encounter (Signed)
PT called states she wanted to see if Dr. Cherylann Ratel could get a note for work. Stating that she need a chair for work, patient states that she stands for 12 hours at work. PT states it causes her to have back pain. Please give patient a call. TY

## 2023-04-15 NOTE — Telephone Encounter (Signed)
Works at [employer].  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  Requesting to have a chair and needs a note from the provider as per her supervisor.  I told Malik that I would pass this along to BL and he would be in on Monday and  I would let her know what he says.

## 2023-04-18 ENCOUNTER — Encounter: Payer: Self-pay | Admitting: *Deleted

## 2023-04-18 NOTE — Telephone Encounter (Signed)
Patient contacted that her work note is ready.  She is going to access MyChart to get the letter to her employer.

## 2023-05-09 ENCOUNTER — Ambulatory Visit: Payer: 59

## 2023-05-10 DIAGNOSIS — F411 Generalized anxiety disorder: Secondary | ICD-10-CM | POA: Diagnosis not present

## 2023-05-25 ENCOUNTER — Ambulatory Visit: Payer: Medicaid Other

## 2023-06-13 ENCOUNTER — Ambulatory Visit: Payer: Medicaid Other | Admitting: Family Medicine

## 2023-06-13 VITALS — BP 120/74 | HR 96 | Ht 64.0 in | Wt 156.0 lb

## 2023-06-13 DIAGNOSIS — Z309 Encounter for contraceptive management, unspecified: Secondary | ICD-10-CM

## 2023-06-13 DIAGNOSIS — Z30013 Encounter for initial prescription of injectable contraceptive: Secondary | ICD-10-CM

## 2023-06-13 DIAGNOSIS — Z3042 Encounter for surveillance of injectable contraceptive: Secondary | ICD-10-CM

## 2023-06-13 MED ORDER — MEDROXYPROGESTERONE ACETATE 150 MG/ML IM SUSY
150.0000 mg | PREFILLED_SYRINGE | Freq: Once | INTRAMUSCULAR | Status: AC
Start: 2023-06-13 — End: 2023-06-13
  Administered 2023-06-13: 150 mg via INTRAMUSCULAR

## 2023-06-13 NOTE — Progress Notes (Unsigned)
Pt here for acute visit for Depo Provera injection.  Physical to be rescheduled due to provider shortage on this date.  Depo Provera 150mg  IM given in RUOQ without complications.  Condoms declined.  Reminder card given to patient with instructions to schedule physical and depo injection with next visit.-Collins Scotland, RN

## 2023-06-15 NOTE — Progress Notes (Signed)
I have reviewed this visit and agree with the documentation.    Fayette Pho, MD 06/15/23  7:06 PM

## 2023-06-22 ENCOUNTER — Ambulatory Visit
Admission: EM | Admit: 2023-06-22 | Discharge: 2023-06-22 | Disposition: A | Discharge status: Home / Self Care | Payer: Medicaid Other | Attending: Physician Assistant | Admitting: Physician Assistant

## 2023-06-22 ENCOUNTER — Other Ambulatory Visit: Payer: Self-pay

## 2023-06-22 ENCOUNTER — Emergency Department
Admission: EM | Admit: 2023-06-22 | Discharge: 2023-06-22 | Disposition: A | Discharge status: Home / Self Care | Payer: Medicaid Other | Attending: Emergency Medicine | Admitting: Emergency Medicine

## 2023-06-22 DIAGNOSIS — A084 Viral intestinal infection, unspecified: Secondary | ICD-10-CM | POA: Diagnosis not present

## 2023-06-22 DIAGNOSIS — R6883 Chills (without fever): Secondary | ICD-10-CM | POA: Diagnosis not present

## 2023-06-22 DIAGNOSIS — R55 Syncope and collapse: Secondary | ICD-10-CM | POA: Insufficient documentation

## 2023-06-22 DIAGNOSIS — R197 Diarrhea, unspecified: Secondary | ICD-10-CM | POA: Diagnosis present

## 2023-06-22 DIAGNOSIS — I10 Essential (primary) hypertension: Secondary | ICD-10-CM | POA: Insufficient documentation

## 2023-06-22 DIAGNOSIS — R110 Nausea: Secondary | ICD-10-CM | POA: Diagnosis not present

## 2023-06-22 DIAGNOSIS — R112 Nausea with vomiting, unspecified: Secondary | ICD-10-CM | POA: Diagnosis not present

## 2023-06-22 DIAGNOSIS — E860 Dehydration: Secondary | ICD-10-CM | POA: Insufficient documentation

## 2023-06-22 LAB — GASTROINTESTINAL PANEL BY PCR, STOOL (REPLACES STOOL CULTURE)

## 2023-06-22 LAB — URINALYSIS, ROUTINE W REFLEX MICROSCOPIC
Bilirubin Urine: NEGATIVE
Glucose, UA: NEGATIVE mg/dL
Hgb urine dipstick: NEGATIVE
Ketones, ur: 80 mg/dL — AB
Leukocytes,Ua: NEGATIVE
Nitrite: NEGATIVE
Protein, ur: 30 mg/dL — AB
Specific Gravity, Urine: 1.027 (ref 1.005–1.030)
pH: 6 (ref 5.0–8.0)

## 2023-06-22 LAB — CBC
HCT: 42 % (ref 36.0–46.0)
Hemoglobin: 14.4 g/dL (ref 12.0–15.0)
MCH: 31 pg (ref 26.0–34.0)
MCHC: 34.3 g/dL (ref 30.0–36.0)
MCV: 90.5 fL (ref 80.0–100.0)
Platelets: 340 10*3/uL (ref 150–400)
RBC: 4.64 MIL/uL (ref 3.87–5.11)
RDW: 11.5 % (ref 11.5–15.5)
WBC: 9.1 10*3/uL (ref 4.0–10.5)
nRBC: 0 % (ref 0.0–0.2)

## 2023-06-22 LAB — COMPREHENSIVE METABOLIC PANEL
ALT: 11 U/L (ref 0–44)
AST: 16 U/L (ref 15–41)
Albumin: 4.2 g/dL (ref 3.5–5.0)
Alkaline Phosphatase: 47 U/L (ref 38–126)
Anion gap: 10 (ref 5–15)
BUN: 10 mg/dL (ref 6–20)
CO2: 21 mmol/L — ABNORMAL LOW (ref 22–32)
Calcium: 9.3 mg/dL (ref 8.9–10.3)
Chloride: 104 mmol/L (ref 98–111)
Creatinine, Ser: 0.61 mg/dL (ref 0.44–1.00)
GFR, Estimated: >60 mL/min (ref >60)
Glucose, Bld: 105 mg/dL — ABNORMAL HIGH (ref 70–99)
Potassium: 4.3 mmol/L (ref 3.5–5.1)
Sodium: 135 mmol/L (ref 135–145)
Total Bilirubin: 0.7 mg/dL (ref 0.3–1.2)
Total Protein: 7.3 g/dL (ref 6.5–8.1)

## 2023-06-22 LAB — LIPASE, BLOOD: Lipase: 28 U/L (ref 11–51)

## 2023-06-22 LAB — C DIFFICILE QUICK SCREEN W PCR REFLEX
C Diff antigen: NEGATIVE
C Diff interpretation: NOT DETECTED
C Diff toxin: NEGATIVE

## 2023-06-22 MED ORDER — METOCLOPRAMIDE HCL 5 MG/ML IJ SOLN
10.0000 mg | Freq: Once | INTRAMUSCULAR | Status: AC
  Administered 2023-06-22: 10 mg via INTRAVENOUS
  Filled 2023-06-22: qty 2

## 2023-06-22 MED ORDER — FAMOTIDINE IN NACL 20-0.9 MG/50ML-% IV SOLN
20.0000 mg | Freq: Once | INTRAVENOUS | Status: AC
  Administered 2023-06-22: 20 mg via INTRAVENOUS
  Filled 2023-06-22: qty 50

## 2023-06-22 MED ORDER — METOCLOPRAMIDE HCL 10 MG PO TABS: 10.0000 mg | ORAL_TABLET | Freq: Three times a day (TID) | ORAL | 0 refills | Status: AC

## 2023-06-22 MED ORDER — METRONIDAZOLE 500 MG PO TABS: 500.0000 mg | ORAL_TABLET | Freq: Three times a day (TID) | ORAL | 0 refills | Status: AC

## 2023-06-22 MED ORDER — ACETAMINOPHEN 325 MG PO TABS
650.0000 mg | ORAL_TABLET | Freq: Once | ORAL | Status: AC
  Administered 2023-06-22: 650 mg via ORAL
  Filled 2023-06-22: qty 2

## 2023-06-22 MED ORDER — SODIUM CHLORIDE 0.9 % IV BOLUS
500.0000 mL | Freq: Once | INTRAVENOUS | Status: AC
  Administered 2023-06-22: 500 mL via INTRAVENOUS

## 2023-06-22 MED ORDER — DICYCLOMINE HCL 20 MG PO TABS: 20.0000 mg | ORAL_TABLET | Freq: Three times a day (TID) | ORAL | 0 refills | Status: AC

## 2023-06-22 NOTE — ED Notes (Signed)
Pt states she feels like she is going to pass out. This rechecked VS at this time. bolus given.

## 2023-06-22 NOTE — Discharge Instructions (Addendum)
Your labs are reassuring.  Symptoms are likely due to a viral gastroenteritis.  Take the prescription antibiotic and the nausea medicine as needed.  Follow-up with your primary provider return to ED if needed.  Take OTC Imodium for ongoing diarrhea.

## 2023-06-22 NOTE — ED Provider Notes (Signed)
[facility]    CSN: [id number] Arrival date & time: 06/22/23  0935      History   Chief Complaint Chief Complaint  Patient presents with   Diarrhea   Nausea   Headache    HPI Leslie Orr is a 22 y.o. female presenting for 3-day history of generalized abdominal pain/cramping, nausea and diarrhea.  Reports 3 episodes of diarrhea yesterday and 1 today.  Reports vomiting on the first day of symptoms but not since.  Has had chills but denies fever, cough, congestion, sore throat, chest pain, shortness of breath.  No urinary symptoms.  Denies any sick contacts.  No recent travel or antibiotic use.  Has taken Tylenol and promethazine which has helped symptoms.  Symptoms have not gotten any better or worse from onset.  Reports he is post to start a new job tomorrow and does not think she can go.  She requests a work note.  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   Chronic pain syndrome 10/22/2020   Sleeping difficulty 01/28/2017   Bruxism 02/11/2016   Neuritis 02/11/2016   Neuropathic pain 02/27/2015   Depression 02/14/2015   Gastroesophageal reflux disease 02/14/2015   Adjustment disorder 09/19/2014   Episodic tension-type headache, not intractable 07/19/2014   Migraine without aura and without status migrainosus, not intractable 02/21/2014   Tension headache 02/21/2014   Idiopathic scoliosis 02/21/2014   ADHD 01/11/2014    Past Surgical History:  Procedure Laterality Date   BACK SURGERY  04/22/14 - 04/27/14   Scoliosis   MULTIPLE TOOTH EXTRACTIONS     2  teeth pulled within the last 2 weeks   OTHER SURGICAL HISTORY  08/18/2000   [place] Stenosis Surgery    OB History     Gravida  0   Para  0   Term  0   Preterm  0   AB  0   Living  0      SAB  0   IAB  0   Ectopic  0   Multiple  0   Live Births  0            Home Medications    Prior to Admission medications   Medication Sig Start Date End Date Taking? Authorizing Provider  ARIPiprazole (ABILIFY) 15 MG tablet Take 15 mg by mouth daily.   Yes [provider]  buPROPion (WELLBUTRIN XL) 150 MG 24 hr tablet Take 150 mg by mouth.   Yes [provider]  cetirizine (ZYRTEC) 10 MG tablet Take 10 mg by mouth daily.   Yes [provider]  dicyclomine (BENTYL) 20 MG tablet Take 1 tablet (20 mg total) by mouth 4 (four) times daily -  before meals and at bedtime. 06/22/23  Yes Shirlee Latch, PA-C  DULoxetine (CYMBALTA) 60 MG capsule 120 mg daily. 03/20/18  Yes [provider]  esomeprazole (NEXIUM) 40 MG capsule Take 40 mg by mouth 2 (two) times daily.  Yes [provider]  fluticasone (FLONASE) 50 MCG/ACT nasal spray SPRAY 1 SPRAY INTO EACH NOSTRIL EVERY DAY 04/10/18  Yes [provider]  gabapentin (NEURONTIN) 300 MG capsule 2 capsules 2 times daily p.o. Patient taking differently: 600 mg in the am 1200 mg in the pm 08/19/20  Yes Keturah Shavers, MD  hydrOXYzine (VISTARIL) 25 MG capsule Take 25 mg by mouth at bedtime. 12/22/20  Yes [provider]  metoprolol succinate (TOPROL-XL) 25 MG 24 hr tablet Take 1 tablet by mouth daily. 12/21/22 12/21/23 Yes [provider]  montelukast (SINGULAIR) 10 MG tablet Take 10 mg by mouth.   Yes [provider]  Multiple Vitamins-Minerals (MULTIVITAMIN WITH MINERALS) tablet Take 1 tablet by mouth daily. 10/31/19  Yes Federico Flake, MD  nortriptyline (PAMELOR) 10 MG capsule Take 10 mg by mouth in the morning.   Yes [provider]  nortriptyline  (PAMELOR) 50 MG capsule Take 50 mg by mouth at bedtime.   Yes [provider]  promethazine (PHENERGAN) 25 MG tablet TAKE 1 TABLET AS NEEDED FOR NAUSEA OR VOMITING 02/17/21  Yes Keturah Shavers, MD  SUMAtriptan (IMITREX) 50 MG tablet May repeat in 2 hours if headache persists or recurs. 03/18/20  Yes Keturah Shavers, MD  ferrous sulfate 325 (65 FE) MG tablet Take 325 mg by mouth. Reports takes three times per week.    [provider]  lisinopril (ZESTRIL) 5 MG tablet Take 5 mg by mouth daily.    [provider]  loperamide (IMODIUM A-D) 2 MG tablet Take 1 tablet (2 mg total) by mouth every other day. 03/03/20   Minna Antis, MD  methylphenidate 36 MG PO CR tablet Take 72 mg by mouth daily. Patient not taking: Reported on 06/13/2023    [provider]  methylPREDNISolone (MEDROL DOSEPAK) 4 MG TBPK tablet Take 4 mg by mouth. For HA Patient not taking: Reported on 06/13/2023    [provider]  ondansetron (ZOFRAN) 4 MG tablet Take 4 mg by mouth every 8 (eight) hours as needed. 12/30/20   [provider]  ondansetron (ZOFRAN-ODT) 4 MG disintegrating tablet  04/10/18   [provider]    Family History Family History  Problem Relation Age of Onset   Drug abuse Mother    Heart Problems Mother    Anxiety disorder Mother    Depression Mother    Other Father        Brain Tumor   Seizures Father    Migraines Father    ADD / ADHD Brother        1 maternal 1/2 brother has ADHD   Depression Maternal Grandmother    Migraines Maternal Grandmother    Arthritis Maternal Grandmother    Hyperlipidemia Maternal Grandmother    Heart Problems Maternal Grandfather    Emphysema Paternal Grandmother    COPD Paternal Grandmother    Diabetes Paternal Grandmother     Social History Social History   Tobacco Use   Smoking status: Former    Current packs/day: 0.00    Average packs/day: 0.2 packs/day for 2.0 years (0.3 ttl pk-yrs)    Types:  Cigarettes    Start date: 04/18/2019    Quit date: 04/17/2021    Years since quitting: 2.1   Smokeless tobacco: Never   Tobacco comments:    Delta 8   Vaping Use   Vaping status: Every Day   Start date: 11/18/2018   Substances: Nicotine, Flavoring  Substance Use Topics   Alcohol  use: Not Currently   Drug use: Not Currently    Types: Marijuana     Allergies   Other, Penicillins, and Tilactase   Review of Systems Review of Systems  Constitutional:  Positive for chills and fatigue. Negative for diaphoresis and fever.  HENT:  Negative for congestion, ear pain, rhinorrhea and sore throat.   Respiratory:  Negative for cough and shortness of breath.   Cardiovascular:  Negative for chest pain.  Gastrointestinal:  Positive for abdominal pain, diarrhea, nausea and vomiting (x1).  Genitourinary:  Negative for difficulty urinating and dysuria.  Musculoskeletal:  Negative for arthralgias and myalgias.  Skin:  Negative for rash.  Neurological:  Positive for headaches. Negative for weakness.     Physical Exam Triage Vital Signs ED Triage Vitals  Encounter Vitals Group     BP 06/22/23 0958 108/74     Systolic BP Percentile --      Diastolic BP Percentile --      Pulse Rate 06/22/23 0958 (!) 136     Resp 06/22/23 0958 18     Temp 06/22/23 0958 98.8 F (37.1 C)     Temp src --      SpO2 06/22/23 0958 99 %     Weight --      Height --      Head Circumference --      Peak Flow --      Pain Score 06/22/23 0953 6     Pain Loc --      Pain Education --      Exclude from Growth Chart --    No data found.  Updated Vital Signs BP 108/74   Pulse (!) 119   Temp 99 F (37.2 C)   Resp 18   SpO2 99%    Physical Exam Vitals and nursing note reviewed.  Constitutional:      General: She is not in acute distress.    Appearance: Normal appearance. She is well-developed. She is not ill-appearing or toxic-appearing.  HENT:     Head: Normocephalic and atraumatic.     Nose: Nose normal.      Mouth/Throat:     Mouth: Mucous membranes are moist.     Pharynx: Oropharynx is clear.  Eyes:     General: No scleral icterus.       Right eye: No discharge.        Left eye: No discharge.     Conjunctiva/sclera: Conjunctivae normal.  Cardiovascular:     Rate and Rhythm: Regular rhythm. Tachycardia present.     Heart sounds: Normal heart sounds.  Pulmonary:     Effort: Pulmonary effort is normal. No respiratory distress.     Breath sounds: Normal breath sounds.  Abdominal:     Palpations: Abdomen is soft.     Tenderness: There is abdominal tenderness (generalized).  Musculoskeletal:     Cervical back: Neck supple.  Skin:    General: Skin is dry.  Neurological:     General: No focal deficit present.     Mental Status: She is alert. Mental status is at baseline.     Motor: No weakness.     Gait: Gait normal.  Psychiatric:        Mood and Affect: Mood normal.        Behavior: Behavior normal.        Thought Content: Thought content normal.      UC Treatments / Results  Labs (all labs ordered are listed, but only abnormal  results are displayed) Labs Reviewed - No data to display  EKG   Radiology No results found.  Procedures Procedures (including critical care time)  Medications Ordered in UC Medications - No data to display  Initial Impression / Assessment and Plan / UC Course  I have reviewed the triage vital signs and the nursing notes.  Pertinent labs & imaging results that were available during my care of the patient were reviewed by me and considered in my medical decision making (see chart for details).   22 year old female presents for generalized abdominal pain, fatigue, headaches, chills, nausea with 1 episode of vomiting and diarrhea x 3 days.  Denies fever, URI symptoms, urinary symptoms.  Taking Tylenol and promethazine with some improvement in symptoms.  Has not had any medication today.  1 episode of diarrhea today.  Patient is tachycardic.   Laying comfortably on exam bed.  Does not appear significantly ill.  Normal HEENT exam.  Abdomen with generalized tenderness to palpation.  No guarding or rebound.  Chest clear.  Likely viral gastroenteritis.  Suggested that she increase her fluid intake and rest.  Advised to continue promethazine and Tylenol.  Sent dicyclomine to pharmacy for cramping.  Also advise she take Imodium after the next episode of diarrhea.  Advised if she develops a fever or worsening abdominal pain she should go to the emergency department.  Work note provided.   Final Clinical Impressions(s) / UC Diagnoses   Final diagnoses:  Viral gastroenteritis  Diarrhea, unspecified type  Nausea  Chills     Discharge Instructions      ABDOMINAL PAIN: You may take Tylenol for pain relief. Use medications as directed including antiemetics and antidiarrheal medications if suggested or prescribed. You should increase fluids and electrolytes as well as rest over these next several days. If you have any questions or concerns, or if your symptoms are not improving or if especially if they acutely worsen, please call or stop back to the clinic immediately and we will be happy to help you or go to the ER   ABDOMINAL PAIN RED FLAGS: Seek immediate further care if: symptoms remain the same or worsen over the next 3-7 days, you are unable to keep fluids down, you see blood or mucus in your stool, you vomit black or dark red material, you have a fever of 101.F or higher, you have localized and/or persistent abdominal pain       ED Prescriptions     Medication Sig Dispense Auth. Provider   dicyclomine (BENTYL) 20 MG tablet Take 1 tablet (20 mg total) by mouth 4 (four) times daily -  before meals and at bedtime. 20 tablet Gareth Morgan      PDMP not reviewed this encounter.   Shirlee Latch, PA-C 06/22/23 1054

## 2023-06-22 NOTE — ED Triage Notes (Signed)
Pt c/o nausea and diarrhea since Sunday.  Denies fever

## 2023-06-22 NOTE — ED Triage Notes (Signed)
First Nurse Note:  Pt via [facility] from home. Pt had 2 syncopal episode, pt did have a fall, she hit the table. Pt has been having NVD since Sunday, diarrhea started today. Pt is A&Ox4 and NAD 88 HR, increases to 155 when she stands up.  145/84 BP 20 G R AC, EMS gave LR.

## 2023-06-22 NOTE — ED Provider Notes (Signed)
[facility] Emergency Department Provider Note     Event Date/Time   First MD Initiated Contact with Patient 06/22/23 1650     (approximate)   History   Loss of Consciousness  HPI  Leslie Orr is a 22 y.o. female with a history of HTN, ADHD, reflux, depression, presents to the ED for evaluation of intermittent nausea, vomiting, and diarrhea since Sunday.  Patient would endorse 2 episodes of syncope today.  She describes poor p.o. intake over the last few days, was lying on the couch prior to onset of her syncopal episode.  She stood up to go to the bathroom, and apparently lost consciousness.  She notes onset of diarrhea today but denies any hematemesis, hematochezia, or melanotic stools.  She presents to the ED via EMS from home, with EMS reports stable vital signs with a 300 mL bolus of LR and route.   Physical Exam   Triage Vital Signs: ED Triage Vitals  Encounter Vitals Group     BP 06/22/23 1509 114/64     Systolic BP Percentile --      Diastolic BP Percentile --      Pulse Rate 06/22/23 1512 91     Resp 06/22/23 1509 18     Temp 06/22/23 1512 97.7 F (36.5 C)     Temp src --      SpO2 06/22/23 1512 100 %     Weight 06/22/23 1510 157 lb (71.2 kg)     Height 06/22/23 1510 5\' 4"  (1.626 m)     Head Circumference --      Peak Flow --      Pain Score 06/22/23 1510 5     Pain Loc --      Pain Education --      Exclude from Growth Chart --     Most recent vital signs: Vitals:   06/22/23 2028 06/22/23 2130  BP:  125/77  Pulse:  77  Resp:  18  Temp: 97.8 F (36.6 C) 97.9 F (36.6 C)  SpO2:  100%    General Awake, no distress. NAD HEENT NCAT. PERRL. EOMI. No rhinorrhea. Mucous membranes are moist.  CV:  Good peripheral perfusion. RRR RESP:  Normal effort.  ABD:  No distention.    ED Results / Procedures / Treatments   Labs (all labs ordered are listed, but only abnormal results are displayed) Labs Reviewed  COMPREHENSIVE  METABOLIC PANEL - Abnormal; Notable for the following components:      Result Value   CO2 21 (*)    Glucose, Bld 105 (*)    All other components within normal limits  URINALYSIS, ROUTINE W REFLEX MICROSCOPIC - Abnormal; Notable for the following components:   Color, Urine YELLOW (*)    APPearance HAZY (*)    Ketones, ur 80 (*)    Protein, ur 30 (*)    Bacteria, UA RARE (*)    All other components within normal limits  GASTROINTESTINAL PANEL BY PCR, STOOL (REPLACES STOOL CULTURE)  C DIFFICILE QUICK SCREEN W PCR REFLEX    CBC  LIPASE, BLOOD     EKG  Vent. rate 115 BPM PR interval 150 ms QRS duration 84 ms QT/QTcB 340/470 ms P-R-T axes 56 130 53 Sinus tachycardia Possible Left atrial enlargement Possible Right ventricular hypertrophy Possible Anterolateral infarct , age undetermined Abnormal ECG When compared with ECG of 07-Jan-2016 10:05, No STEMI  RADIOLOGY   No results found.   PROCEDURES:  Critical  Care performed: No  Procedures   MEDICATIONS ORDERED IN ED: Medications  sodium chloride 0.9 % bolus 500 mL (0 mLs Intravenous Stopped 06/22/23 1802)  metoCLOPramide (REGLAN) injection 10 mg (10 mg Intravenous Given 06/22/23 1755)  famotidine (PEPCID) IVPB 20 mg premix (0 mg Intravenous Stopped 06/22/23 1802)  acetaminophen (TYLENOL) tablet 650 mg (650 mg Oral Given 06/22/23 1800)     IMPRESSION / MDM / ASSESSMENT AND PLAN / ED COURSE  I reviewed the triage vital signs and the nursing notes.                              Differential diagnosis includes, but is not limited to, ovarian cyst, ovarian torsion, acute appendicitis, diverticulitis, urinary tract infection/pyelonephritis, endometriosis, bowel obstruction, colitis, renal colic, gastroenteritis, hernia, fibroids, endometriosis, pregnancy related pain including ectopic pregnancy, etc.  Patient's presentation is most consistent with acute complicated illness / injury requiring diagnostic workup.  Patient's  diagnosis is consistent with diarrhea likely viral etiology, with single episode likely secondary to dehydration and poor p.o. intake.  Patient presents to the ED in no acute distress with reassuring vital signs on presentation.  Labs and evaluation overall reassuring with no significant lab abnormalities.  Stool sample PCR negative for any acute infectious process.  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  Patient will be discharged home with prescriptions for metronidazole and Zofran.  Is also advised to take OTC Imodium to help counter diarrhea symptoms.  Primary provider as patient is to follow up with her primary provider as discussed, as needed or otherwise directed. Patient is given ED precautions to return to the ED for any worsening or new symptoms.  FINAL CLINICAL IMPRESSION(S) / ED DIAGNOSES   Final diagnoses:  Diarrhea of presumed infectious origin  Dehydration     Rx / DC Orders   ED Discharge Orders          Ordered    metroNIDAZOLE (FLAGYL) 500 MG tablet  3 times daily        06/22/23 2123    metoCLOPramide (REGLAN) 10 MG tablet  3 times daily with meals        06/22/23 2124             Note:  This document was prepared using Dragon voice recognition software and may include unintentional dictation errors.    Lissa Hoard, PA-C 06/22/23 2341    Chesley Noon, MD 06/23/23 [phone]

## 2023-06-22 NOTE — ED Triage Notes (Signed)
See first nurse note. Pt reports n/v/d since Sunday. States passed out today. +abd pain

## 2023-06-22 NOTE — Discharge Instructions (Signed)

## 2023-07-15 NOTE — Telephone Encounter (Signed)
error 

## 2023-08-19 ENCOUNTER — Encounter: Payer: Self-pay | Admitting: Nurse Practitioner

## 2023-08-19 ENCOUNTER — Ambulatory Visit: Payer: Medicaid Other | Admitting: Nurse Practitioner

## 2023-08-19 ENCOUNTER — Other Ambulatory Visit: Payer: Self-pay

## 2023-08-19 VITALS — BP 110/60 | Ht 64.0 in | Wt 162.0 lb

## 2023-08-19 DIAGNOSIS — Z309 Encounter for contraceptive management, unspecified: Secondary | ICD-10-CM | POA: Diagnosis not present

## 2023-08-19 DIAGNOSIS — Z113 Encounter for screening for infections with a predominantly sexual mode of transmission: Secondary | ICD-10-CM

## 2023-08-19 DIAGNOSIS — Z01419 Encounter for gynecological examination (general) (routine) without abnormal findings: Secondary | ICD-10-CM | POA: Diagnosis not present

## 2023-08-19 LAB — WET PREP FOR TRICH, YEAST, CLUE
Trichomonas Exam: NEGATIVE
Yeast Exam: NEGATIVE

## 2023-08-19 LAB — HM HEPATITIS C SCREENING LAB: HM Hepatitis Screen: NEGATIVE

## 2023-08-19 LAB — HM HIV SCREENING LAB: HM HIV Screening: NEGATIVE

## 2023-08-19 NOTE — Progress Notes (Unsigned)
[facility] DEPARTMENT [facility] [address] Main Number: [phone]  Family Planning Visit- Repeat Yearly Visit  Subjective:  Leslie Orr is a 22 y.o. G0P0000  being seen today for an annual wellness visit and to discuss contraception options.   The patient is currently using Hormonal Injection for pregnancy prevention. Patient does not want a pregnancy in the next year.   They report they are looking for a method that provides High efficacy at preventing pregnancy   Patient has the following medical problems: has Migraine without aura and without status migrainosus, not intractable; Tension headache; Idiopathic scoliosis; Episodic tension-type headache, not intractable; Neuropathic pain; Bruxism; Neuritis; Sleeping difficulty; Depression; Gastroesophageal reflux disease; ADHD; S/P lumbar spinal fusion; Intercostal neuralgia (right side); Chronic pain syndrome; Adjustment disorder; Myofascial pain syndrome of lumbar spine; and Cervicalgia on their problem list.  Chief Complaint  Patient presents with   Annual Exam    PE    Patient reports to clinic today for annual well woman physical. She is about 2 weeks too soon for next depo injection so she will return to the clinic for that. She has no concerns today but does wish to have asymptomatic STI screening performed while here. Patient reports 1 female partner, vaginal and oral sex, condoms always and no history of STI.  She endorses migraine and tension-type headaches associated with nausea that are managed by a PCP. She also endorses IBS managed by PCP. No other concerns at this time.   See flowsheet for other program required questions.   Body mass index is 27.81 kg/m. - Patient is eligible for diabetes screening based on BMI> 25 and age >35?  not applicable HA1C ordered? not applicable  Patient reports 1 of partners in last year. Desires STI screening?  Yes   Has patient been  screened once for HCV in the past?  No  Does the patient have current of drug use, have a partner with drug use, and/or has been incarcerated since last result? Yes  If yes-- Screen for HCV through [facility]   Does the patient meet criteria for HBV testing? No  Criteria:  -Household, sexual or needle sharing contact with HBV -History of drug use -HIV positive -Those with known Hep C   Health Maintenance Due  Topic Date Due   CHLAMYDIA SCREENING  Never done   Hepatitis C Screening  Never done   DTaP/Tdap/Td (2 - Td or Tdap) 10/14/2020   COVID-19 Vaccine (1 - 2023-24 season) Never done    Review of Systems  Gastrointestinal:  Positive for nausea (Nausea with headaches.).  Neurological:  Positive for headaches (Patient has a PCP who manages her headaches. Reports them as migraines and tension type.).  All other systems reviewed and are negative.   The following portions of the patient's history were reviewed and updated as appropriate: allergies, current medications, past family history, past medical history, past social history, past surgical history and problem list. Problem list updated.  Objective:   Vitals:   08/19/23 1012  BP: 110/60  Weight: 162 lb (73.5 kg)  Height: 5\' 4"  (1.626 m)    Physical Exam Vitals and nursing note reviewed. Exam conducted with a chaperone present Larry Sierras, [facility] present during PE).  Constitutional:      Appearance: Normal appearance.  HENT:     Head: Normocephalic and atraumatic.     Salivary Glands: Right salivary gland is not diffusely enlarged or tender. Left salivary gland is not  diffusely enlarged or tender.     Mouth/Throat:     Lips: Pink. No lesions.     Mouth: Mucous membranes are moist.     Tongue: No lesions. Tongue does not deviate from midline.     Pharynx: Oropharynx is clear. Uvula midline. No oropharyngeal exudate or posterior oropharyngeal erythema.     Tonsils: No tonsillar exudate.  Eyes:     General:         Right eye: No discharge.        Left eye: No discharge.  Cardiovascular:     Rate and Rhythm: Normal rate and regular rhythm.     Heart sounds: Normal heart sounds, S1 normal and S2 normal.  Pulmonary:     Effort: Pulmonary effort is normal.     Breath sounds: Normal breath sounds.  Abdominal:     General: Abdomen is flat. A surgical scar is present. Bowel sounds are normal. There is no distension.     Palpations: Abdomen is soft. There is no mass.     Tenderness: There is no abdominal tenderness. There is no right CVA tenderness, left CVA tenderness, guarding or rebound.       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   Genitourinary:    General: Normal vulva.     Exam position: Lithotomy position.     Pubic Area: No rash or pubic lice.      Tanner stage (genital): 5.     Labia:        Right: No rash, tenderness, lesion or injury.        Left: No rash, tenderness, lesion or injury.      Vagina: Normal. No vaginal discharge, erythema, bleeding or lesions.     Cervix: Normal. No cervical motion tenderness, discharge, friability, lesion, erythema, cervical bleeding or eversion.     Uterus: Normal.      Adnexa: Right adnexa normal and left adnexa normal.     Comments: pH = <4.5 No abnormal discharge present.  Lymphadenopathy:     Head:     Right side of head: No submental, submandibular, tonsillar, preauricular or posterior auricular adenopathy.     Left side of head: No submental, submandibular, tonsillar, preauricular or posterior auricular adenopathy.     Cervical: No cervical adenopathy.     Right cervical: No superficial or posterior cervical adenopathy.    Left cervical: No superficial or posterior cervical adenopathy.     Upper Body:     Right upper body: No supraclavicular or axillary adenopathy.     Left upper body: No supraclavicular or axillary adenopathy.     Lower Body: No right  inguinal adenopathy. No left inguinal adenopathy.  Skin:    General: Skin is warm and dry.     Findings: No rash.     Comments: Skin tone appropriate for ethnicity.   Neurological:     Mental Status: She is alert and oriented to person, place, and time.  Psychiatric:        Attention and Perception: Attention normal.        Mood and Affect: Mood is anxious.        Speech: Speech normal.        Behavior: Behavior is hyperactive. Behavior is cooperative.        Thought Content: Thought content normal.    Assessment and Plan:  Leslie Orr is a 22 y.o. female  G0P0000 presenting to the [facility] Department for an yearly wellness and contraception visit   Contraception counseling: Reviewed options based on patient desire and reproductive life plan. Patient is interested in Hormonal Injection. This was not provided to the patient today as previously mentioned in note, patient is about 2 weeks early for injection and will return.   Risks, benefits, and typical effectiveness rates were reviewed.  Questions were answered.  Written information was also given to the patient to review.    The patient will follow up in  2 weeks for surveillance.  The patient was told to call with any further questions, or with any concerns about this method of contraception.  Emphasized use of condoms 100% of the time for STI prevention.  Educated on ECP and assessed need for ECP. Patient was not offered ECP based on the fact she gets depo injection as scheduled.    1. Screening for venereal disease Performed per patient request and policy.   - Chlamydia/Gonorrhea [place] [facility] - HIV/HCV [facility] - Syphilis Serology, [place] [facility] - WET PREP FOR TRICH, YEAST, CLUE - Gonococcus culture  2. Well woman exam Annual physical performed. PAP not due, as last PAP was 05/04/22 NILM, HPV not reflexed as per guidelines.  No CBE performed today based on guidelines.   Return in about 2 weeks  (around 09/02/2023) for Next Depo injection.  No future appointments.  Edmonia James, NP

## 2023-08-19 NOTE — Progress Notes (Unsigned)
Pt is here for PE and STI testing.  Wet mount results reviewed, no treatment required per SO.  FP packet given to pt.  Berdie Ogren, RN

## 2023-08-22 NOTE — Progress Notes (Signed)

## 2023-08-24 LAB — GONOCOCCUS CULTURE

## 2023-08-31 ENCOUNTER — Ambulatory Visit: Payer: Medicaid Other

## 2023-08-31 VITALS — BP 121/68 | Ht 64.0 in | Wt 158.0 lb

## 2023-08-31 DIAGNOSIS — Z309 Encounter for contraceptive management, unspecified: Secondary | ICD-10-CM | POA: Diagnosis not present

## 2023-08-31 DIAGNOSIS — Z30013 Encounter for initial prescription of injectable contraceptive: Secondary | ICD-10-CM | POA: Diagnosis not present

## 2023-08-31 DIAGNOSIS — Z3042 Encounter for surveillance of injectable contraceptive: Secondary | ICD-10-CM

## 2023-08-31 DIAGNOSIS — Z3009 Encounter for other general counseling and advice on contraception: Secondary | ICD-10-CM

## 2023-08-31 MED ORDER — MEDROXYPROGESTERONE ACETATE 150 MG/ML IM SUSP
150.0000 mg | INTRAMUSCULAR | Status: AC
Start: 2023-08-31 — End: 2024-08-02
  Administered 2023-08-31 – 2024-08-02 (×5): 150 mg via INTRAMUSCULAR

## 2023-08-31 NOTE — Progress Notes (Signed)
11 weeks 2 days post depo. Voices no concerns. Depo given today per order by Leona Singleton, FNP dated 08/31/2023.  Tolerated well LUOQ. Next depo due 11/16/2023, has reminder. Jerel Shepherd, RN

## 2023-11-16 ENCOUNTER — Ambulatory Visit: Payer: Medicaid Other

## 2023-11-16 VITALS — BP 120/68 | Ht 64.0 in | Wt 163.0 lb

## 2023-11-16 DIAGNOSIS — Z309 Encounter for contraceptive management, unspecified: Secondary | ICD-10-CM

## 2023-11-16 DIAGNOSIS — Z30013 Encounter for initial prescription of injectable contraceptive: Secondary | ICD-10-CM

## 2023-11-16 DIAGNOSIS — Z3042 Encounter for surveillance of injectable contraceptive: Secondary | ICD-10-CM

## 2023-11-16 DIAGNOSIS — Z3009 Encounter for other general counseling and advice on contraception: Secondary | ICD-10-CM

## 2023-11-16 NOTE — Progress Notes (Signed)
11 weeks post Depo. Voices no concerns. Depo given IM per order J. Idol,FNP dated 08/31/23. Tolerated well to RUOQ.Next appt 02/01/2024

## 2024-02-08 ENCOUNTER — Ambulatory Visit

## 2024-02-08 VITALS — BP 122/54 | Ht 64.0 in | Wt 173.0 lb

## 2024-02-08 DIAGNOSIS — Z309 Encounter for contraceptive management, unspecified: Secondary | ICD-10-CM | POA: Diagnosis not present

## 2024-02-08 DIAGNOSIS — Z30013 Encounter for initial prescription of injectable contraceptive: Secondary | ICD-10-CM

## 2024-02-08 DIAGNOSIS — Z3042 Encounter for surveillance of injectable contraceptive: Secondary | ICD-10-CM

## 2024-02-08 DIAGNOSIS — Z3009 Encounter for other general counseling and advice on contraception: Secondary | ICD-10-CM

## 2024-02-08 NOTE — Progress Notes (Signed)
 12 Weeks   0 Days since last Depo  Voices no concerns today.  Counseled to adhere to 11 to 13 week intervals between depo injections for optimal benefit.  Depo given today per order by Claryce Cruel, FNP  dated 08/31/2023.  Tolerated well LUOQ.  Next depo due 04/25/2024 has reminder card. Graceanna Theissen, RN

## 2024-04-05 ENCOUNTER — Ambulatory Visit (INDEPENDENT_AMBULATORY_CARE_PROVIDER_SITE_OTHER)

## 2024-04-05 ENCOUNTER — Ambulatory Visit
Admission: EM | Admit: 2024-04-05 | Discharge: 2024-04-05 | Disposition: A | Discharge status: Home / Self Care | Attending: Family Medicine | Admitting: Family Medicine

## 2024-04-05 DIAGNOSIS — M25572 Pain in left ankle and joints of left foot: Secondary | ICD-10-CM

## 2024-04-05 DIAGNOSIS — M25571 Pain in right ankle and joints of right foot: Secondary | ICD-10-CM | POA: Diagnosis not present

## 2024-04-05 DIAGNOSIS — S93402A Sprain of unspecified ligament of left ankle, initial encounter: Secondary | ICD-10-CM | POA: Diagnosis not present

## 2024-04-05 MED ORDER — IBUPROFEN 800 MG PO TABS
800.0000 mg | ORAL_TABLET | Freq: Once | ORAL | Status: AC
  Administered 2024-04-05: 800 mg via ORAL

## 2024-04-05 MED ORDER — NAPROXEN 500 MG PO TABS: 500.0000 mg | ORAL_TABLET | Freq: Two times a day (BID) | ORAL | 0 refills | Status: AC

## 2024-04-05 NOTE — ED Triage Notes (Signed)
 Pt st's she stepped in a hole yesterday causing her to roll both ankles.  Pt c/o bil ankle pain swelling present

## 2024-04-05 NOTE — Discharge Instructions (Addendum)
 If medication was prescribed, stop by the pharmacy to pick up your prescriptions.  For your  pain, Take 1500 mg Tylenol  twice a day, take Naprosyn twice a day,  as needed for pain. Wear your ankle brace and use crutches with activity.  Rest and elevate the affected painful area.  Apply cold compresses intermittently, as needed.  As pain recedes, begin normal activities slowly as tolerated.  Follow up with primary care provider or an orthopedic provider, if symptoms persist.  Watch for worsening symptoms such as an increasing weakness or loss of sensation, increasing pain and/or the loss of bladder or bowel function. Should any of these occur, go to the emergency department immediately.

## 2024-04-05 NOTE — ED Provider Notes (Incomplete)
 [facility]    CSN: [id number] Arrival date & time: 04/05/24  0907      History   Chief Complaint Chief Complaint  Patient presents with  . Ankle Pain    HPI  HPI Leslie Orr is a 23 y.o. female.   Leslie Orr presents for *** pain.  ***Injury occurred, mechanism and which ***shoulder injured.  Reports *** immediate pain in his shoulder ***. Did ***not hear any pop or abnormal sounds with his injury.  Continues to have *** description pain when moving his arm forward. Denies ***redness, swelling. Does not feel like her ***arm is weak. Has tried *** with little relief.  ***No change in pain day vs night.  Has ***never injured this *** shoulder before.  ***she is ***right handed.    Fever : no  Sore throat: no   Cough: no Appetite: normal  Hydration: normal  Abdominal pain: no Nausea: no Vomiting: no Sleep disturbance: no *** Back Pain: no Headache: no     Past Medical History:  Diagnosis Date  . Acid reflux   . ADHD   . Depression   . Hypertension   . Migraines   . Nausea    due to reflux  . Neuralgia    intercostal nerve pain at ribs  . Scoliosis   . Seasonal allergies     Patient Active Problem List   Diagnosis Date Noted  . Cervicalgia 12/28/2022  . Myofascial pain syndrome of lumbar spine 01/12/2022  . S/P lumbar spinal fusion 10/22/2020  . 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 Chronic pain syndrome 10/22/2020  . Sleeping difficulty 01/28/2017  . Bruxism 02/11/2016  . Neuritis 02/11/2016  . Neuropathic pain 02/27/2015  . Depression 02/14/2015  . Gastroesophageal reflux disease 02/14/2015  . Adjustment disorder 09/19/2014  . Episodic tension-type headache, not intractable 07/19/2014  . Migraine without aura and without status migrainosus, not intractable 02/21/2014  . Tension headache 02/21/2014  . Idiopathic scoliosis 02/21/2014  . ADHD 01/11/2014    Past Surgical History:  Procedure Laterality Date  . BACK  SURGERY  04/22/14 - 04/27/14   Scoliosis  . MULTIPLE TOOTH EXTRACTIONS     2 teeth pulled within the last 2 weeks  . OTHER SURGICAL HISTORY  08/18/2000   [facility] Stenosis Surgery    OB History     Gravida  0   Para  0   Term  0   Preterm  0   AB  0   Living  0      SAB  0   IAB  0   Ectopic  0   Multiple  0   Live Births  0            Home Medications    Prior to Admission medications   Medication Sig Start Date End Date Taking? Authorizing Provider  ARIPiprazole (ABILIFY) 15 MG tablet Take 15 mg by mouth daily.    [provider]  buPROPion (WELLBUTRIN XL) 150 MG 24 hr tablet Take 150 mg by mouth.    [provider]  cetirizine (ZYRTEC) 10 MG tablet Take 10 mg by mouth daily.    [provider]  dicyclomine  (BENTYL ) 20 MG tablet Take 1 tablet (20 mg total) by mouth 4 (four) times daily -  before meals and at bedtime. Patient not taking: Reported on 02/08/2024 06/22/23   Floydene Hy, PA-C  DULoxetine (CYMBALTA) 60 MG capsule 120 mg daily. Patient not taking: Reported on 08/19/2023 03/20/18  [provider]  esomeprazole (NEXIUM) 40 MG capsule Take 40 mg by mouth 2 (two) times daily.    [provider]  ferrous sulfate 325 (65 FE) MG tablet Take 325 mg by mouth. Reports takes three times per week.    [provider]  fluticasone (FLONASE) 50 MCG/ACT nasal spray SPRAY 1 SPRAY INTO EACH NOSTRIL EVERY DAY 04/10/18   [provider]  gabapentin  (NEURONTIN ) 300 MG capsule 2 capsules 2 times daily p.o. Patient taking differently: 600 mg in the am 1200 mg in the pm 08/19/20   Nabizadeh, Reza, MD  hydrOXYzine (VISTARIL) 25 MG capsule Take 25 mg by mouth at bedtime. 12/22/20   [provider]  lisinopril (ZESTRIL) 5 MG tablet Take 5 mg by mouth daily. Patient not taking: Reported on 08/19/2023    [provider]  loperamide  (IMODIUM  A-D) 2 MG tablet Take 1 tablet (2 mg total) by mouth every other  day. 03/03/20   Ruth Cove, MD  methylphenidate 36 MG PO CR tablet Take 72 mg by mouth daily.    [provider]  methylPREDNISolone (MEDROL DOSEPAK) 4 MG TBPK tablet Take 4 mg by mouth. For HA Patient not taking: Reported on 06/13/2023    [provider]  metoCLOPramide  (REGLAN ) 10 MG tablet Take 1 tablet (10 mg total) by mouth 3 (three) times daily with meals for 5 days. 06/22/23 06/27/23  Menshew, Raye Cai, PA-C  metoprolol succinate (TOPROL-XL) 25 MG 24 hr tablet Take 1 tablet by mouth daily. 12/21/22 12/21/23  [provider]  montelukast (SINGULAIR) 10 MG tablet Take 10 mg by mouth. Patient not taking: Reported on 02/08/2024    [provider]  Multiple Vitamins-Minerals (MULTIVITAMIN WITH MINERALS) tablet Take 1 tablet by mouth daily. 10/31/19   Abner Ables, MD  nortriptyline (PAMELOR) 10 MG capsule Take 10 mg by mouth in the morning.    [provider]  nortriptyline (PAMELOR) 50 MG capsule Take 50 mg by mouth at bedtime.    [provider]  ondansetron  (ZOFRAN ) 4 MG tablet Take 4 mg by mouth every 8 (eight) hours as needed. 12/30/20   [provider]  ondansetron  (ZOFRAN -ODT) 4 MG disintegrating tablet  04/10/18   [provider]  promethazine  (PHENERGAN ) 25 MG tablet TAKE 1 TABLET AS NEEDED FOR NAUSEA OR VOMITING 02/17/21   Ventura Gins, MD  SUMAtriptan  (IMITREX ) 50 MG tablet May repeat in 2 hours if headache persists or recurs. 03/18/20   Ventura Gins, MD    Family History Family History  Problem Relation Age of Onset  . Drug abuse Mother   . Heart Problems Mother   . Anxiety disorder Mother   . Depression Mother   . Other Father        Brain Tumor  . Seizures Father   . Migraines Father   . ADD / ADHD Brother        1 maternal 1/2 brother has ADHD  . Depression Maternal Grandmother   . Migraines Maternal Grandmother   . Arthritis Maternal Grandmother   . Hyperlipidemia Maternal  Grandmother   . Heart Problems Maternal Grandfather   . Emphysema Paternal Grandmother   . COPD Paternal Grandmother   . Diabetes Paternal Grandmother     Social History Social History   Tobacco Use  . Smoking status: Former    Current packs/day: 0.00    Average packs/day: 0.2 packs/day for 2.0 years (0.3 ttl pk-yrs)    Types: Cigarettes  Start date: 04/18/2019    Quit date: 04/17/2021    Years since quitting: 2.9  . Smokeless tobacco: Never  . Tobacco comments:    Delta 8   Vaping Use  . Vaping status: Every Day  . Start date: 11/18/2018  . Substances: Nicotine, Flavoring  Substance Use Topics  . Alcohol use: Not Currently  . Drug use: Not Currently    Types: Marijuana     Allergies   Other, Penicillins, and Tilactase   Review of Systems Review of Systems: :negative unless otherwise stated in HPI.      Physical Exam Triage Vital Signs ED Triage Vitals  Encounter Vitals Group     BP 04/05/24 0939 123/82     Girls Systolic BP Percentile --      Girls Diastolic BP Percentile --      Boys Systolic BP Percentile --      Boys Diastolic BP Percentile --      Pulse Rate 04/05/24 0939 100     Resp 04/05/24 0939 18     Temp 04/05/24 0939 98.5 F (36.9 C)     Temp Source 04/05/24 0939 Oral     SpO2 04/05/24 0939 97 %     Weight --      Height --      Head Circumference --      Peak Flow --      Pain Score 04/05/24 0941 8     Pain Loc --      Pain Education --      Exclude from Growth Chart --    No data found.  Updated Vital Signs BP 123/82 (BP Location: Right Arm)   Pulse 100   Temp 98.5 F (36.9 C) (Oral)   Resp 18   SpO2 97%   Visual Acuity Right Eye Distance:   Left Eye Distance:   Bilateral Distance:    Right Eye Near:   Left Eye Near:    Bilateral Near:     Physical Exam GEN: well appearing female in no acute distress  CVS: well perfused  RESP: speaking in full sentences without pause, no respiratory distress  MSK:   *** shoulder:  No  evidence of bony deformity, asymmetry, or muscle atrophy. No tenderness over long head of biceps (bicipital groove).  No TTP at [facility] joint.  Full active and passive (ABD, ADD, Flexion, extension, IR, ER). Limited *** 2/2 to pain.  Strength 5/5 grip, elbow and shoulder. No abnormal scapular function observed.  Special Tests: Zoila Hines: ***; Empty Can: ***, Neer's: Negative; Painful arc: Negative; Anterior Apprehension: Negative Sensation intact. Peripheral pulses intact.   UC Treatments / Results  Labs (all labs ordered are listed, but only abnormal results are displayed) Labs Reviewed - No data to display  EKG   Radiology No results found.   Procedures Procedures (including critical care time)  Medications Ordered in UC Medications - No data to display  Initial Impression / Assessment and Plan / UC Course  I have reviewed the triage vital signs and the nursing notes.  Pertinent labs & imaging results that were available during my care of the patient were reviewed by me and considered in my medical decision making (see chart for details).      Pt is a 23 y.o.  female with *** days of *** shoulder pain after ***.   On exam, pt has tenderness at *** concerning for ***.   Obtained *** shoulder plain films.  Personally interpreted by me were ***  unremarkable for fracture or dislocation. Radiologist report reviewed and additionally notes *** no soft tissue swelling.  Given ***Toradol IM/sling/brace/crutches  Patient to gradually return to normal activities, as tolerated and continue ordinary activities within the limits permitted by pain. Prescribed Naproxen sodium *** and muscle relaxer *** for pain relief.  Tylenol  PRN. Advised patient to avoid OTC NSAIDs while taking prescription NSAID. Counseled patient on red flag symptoms and when to seek immediate care.  ***No red flags such as progressive major motor weakness.   Patient to follow up with orthopedic provider, if symptoms do not  improve with conservative treatment.  Return and ED precautions given. Understanding voiced. Discussed MDM, treatment plan and plan for follow-up with patient/parent who agrees with plan.   Final Clinical Impressions(s) / UC Diagnoses   Final diagnoses:  None   Discharge Instructions   None    ED Prescriptions   None    PDMP not reviewed this encounter.

## 2024-05-03 ENCOUNTER — Ambulatory Visit

## 2024-05-03 VITALS — BP 105/57 | Ht 64.0 in | Wt 171.5 lb

## 2024-05-03 DIAGNOSIS — Z3042 Encounter for surveillance of injectable contraceptive: Secondary | ICD-10-CM

## 2024-05-03 DIAGNOSIS — Z30013 Encounter for initial prescription of injectable contraceptive: Secondary | ICD-10-CM | POA: Diagnosis not present

## 2024-05-03 DIAGNOSIS — Z3009 Encounter for other general counseling and advice on contraception: Secondary | ICD-10-CM

## 2024-05-03 DIAGNOSIS — Z309 Encounter for contraceptive management, unspecified: Secondary | ICD-10-CM

## 2024-05-03 NOTE — Progress Notes (Signed)
 12 Weeks   1 Days since last Depo    Voices no concerns today.  Counseled to adhere to 11 to 13 week intervals between depo injections for optimal benefit.  Depo given today per order by JINNY Narrow, FNP  dated 08/31/2023.  Tolerated well RUOQ.  Next depo due 07/19/2024,  has reminder card.  Demetrie Borge, RN

## 2024-08-02 ENCOUNTER — Ambulatory Visit

## 2024-08-02 VITALS — BP 132/84 | Wt 155.5 lb

## 2024-08-02 DIAGNOSIS — Z3042 Encounter for surveillance of injectable contraceptive: Secondary | ICD-10-CM

## 2024-08-02 DIAGNOSIS — Z30013 Encounter for initial prescription of injectable contraceptive: Secondary | ICD-10-CM | POA: Diagnosis not present

## 2024-08-02 DIAGNOSIS — Z309 Encounter for contraceptive management, unspecified: Secondary | ICD-10-CM | POA: Diagnosis not present

## 2024-08-02 NOTE — Progress Notes (Signed)
 13 weeks post last depo. Voices no concerns. Depo given IM LUOQ per order dated 08/31/23. Next depo due 10/18/24.Pt advised to schedule annual exam with next depo .

## 2024-11-02 ENCOUNTER — Encounter: Payer: Self-pay | Admitting: Nurse Practitioner

## 2024-11-02 ENCOUNTER — Ambulatory Visit: Admitting: Nurse Practitioner

## 2024-11-02 VITALS — BP 115/61 | HR 89 | Ht 64.0 in | Wt 154.8 lb

## 2024-11-02 DIAGNOSIS — Z309 Encounter for contraceptive management, unspecified: Secondary | ICD-10-CM

## 2024-11-02 DIAGNOSIS — Z30013 Encounter for initial prescription of injectable contraceptive: Secondary | ICD-10-CM

## 2024-11-02 DIAGNOSIS — Z3009 Encounter for other general counseling and advice on contraception: Secondary | ICD-10-CM

## 2024-11-02 DIAGNOSIS — Z0000 Encounter for general adult medical examination without abnormal findings: Secondary | ICD-10-CM

## 2024-11-02 MED ORDER — MEDROXYPROGESTERONE ACETATE 150 MG/ML IM SUSP
150.0000 mg | INTRAMUSCULAR | Status: AC
  Administered 2024-11-02: 150 mg via INTRAMUSCULAR

## 2024-11-02 NOTE — Progress Notes (Signed)
 " [facility] Leslie Orr [address], [address] [place] [address] Main phone: [phone]  Family Planning Visit - Repeat Yearly Visit  Subjective:  Leslie Orr is a 24 y.o. G0P0000  being seen today for an annual wellness visit and to discuss contraception options. The patient is currently using hormonal injection for pregnancy prevention. Patient does not want a pregnancy in the next year.   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 She is established with a PCP, therapist, and dentist.   Patient denies urinary or GI problems, dyspareunia, pelvic pain, breast concerns.   ROS  See flowsheet for further details and programmatic requirements Hyperlink available at the top of the signed note in blue.  Flow sheet content below:  Pregnancy Intention Screening Does the patient want to become pregnant in the next year?: No Does the patient's partner want to become pregnant in the next year?: N/A Would the patient  like to discuss contraceptive options today?: No Sexual History What age did you start your period?: 11 How often do you have your period?: no periods with depo Date of last sex?: 10/23/24 Has the patient had unprotected sex within the last 5 days?: No Do you have sex with men, women, both men and women?: Men only In the past 2 months how many partners have you had sex with?: 1 In the past 12 months, how many partners have you had sex with?: 1 Is it possible that any of your sex partners in the past 12 months had sex with someone else whild they were still in a sexual relationship with you?: No What ways do you have sex?: Vaginal, Oral Do you or your partner use condoms and/or dental dams every time you have vaginal, oral or anal sex?: Condoms only Do you douche?: No Date of last HIV test?: 08/19/23 Have you ever had an STD?: No Have any of your partners had an STD?: No Have you or your partner ever shot up drugs?: No Have any of your partners used drugs in the past?: No Have you or your partners exchanged money or drugs for sex?: No  Diabetes screening This patient is 24 y.o. with a BMI of Body mass index is 26.57 kg/m.Leslie Orr  Is patient eligible for diabetes screening (age >35 and BMI >25)?  no  Was Hgb A1c ordered? no  STI screening Patient reports 1 of partners in last year.  Does this patient desire STI screening?  No - screened in 11/25  Hepatitis C screening Has patient been screened once for HCV in the past?  No  No results found for: HCVAB  Does the patient meet criteria for HCV testing? No  (If yes-- Screen for HCV through [facility]) Criteria:  Since the last HCV result, does the patient have any of the following? - Current drug use - Have a partner with drug use - Has been incarcerated  Hepatitis B screening Does the patient meet criteria for HBV testing? No Criteria:  -Household, sexual or needle sharing contact with HBV -History of drug use -HIV  positive -Those with known Hep C  Cervical Cancer Screening  Result Date Procedure Results Follow-ups  05/04/2022 Pap IG (Image Guided) DIAGNOSIS:: Comment Specimen adequacy:: Comment Clinician Provided ICD10: Comment Performed by:: Comment PAP Smear Comment: . Note:: Comment Test Methodology: Comment     Health Maintenance Due  Topic Date Due   CHLAMYDIA SCREENING  Never done   DTaP/Tdap/Td (2 - Td or Tdap) 10/14/2020   COVID-19 Vaccine (1 - 2025-26 season) Never done    The following portions of the patient's history were reviewed and updated as appropriate: allergies, current medications, past family history, past medical history, past social history, past surgical history and problem list. Problem list updated.  Objective:   Vitals:   11/02/24 0857  BP: 115/61  Pulse: 89  Weight: 154 lb 12.8 oz (70.2 kg)  Height: 5' 4 (1.626 m)    Physical Exam Vitals and nursing note reviewed.  Constitutional:      Appearance: Normal appearance.  HENT:     Head: Normocephalic and atraumatic.     Comments: No nits or hair loss on scalp, brows, and lashes    Mouth/Throat:     Mouth: Mucous membranes are moist.     Pharynx: Oropharynx is clear. No oropharyngeal exudate or posterior oropharyngeal erythema.  Eyes:     General:        Right eye: No discharge.        Left eye: No discharge.     Conjunctiva/sclera: Conjunctivae normal.     Right eye: Right conjunctiva is not injected.     Left eye: Left conjunctiva is not injected.  Cardiovascular:     Heart sounds: Normal heart sounds.  Pulmonary:     Effort: Pulmonary effort is normal.     Breath sounds: Normal breath sounds.  Abdominal:     Tenderness: There is no abdominal tenderness. There is no rebound.  Genitourinary:    Comments: Politely declined genital exam. Lymphadenopathy:     Cervical: No cervical adenopathy.     Upper Body:     Right upper body: No supraclavicular, axillary or epitrochlear adenopathy.      Left upper body: No supraclavicular, axillary or epitrochlear adenopathy.     Comments: Patient declines pelvic exam, inguinal lymph nodes not evaluated.  Skin:    General: Skin is warm and dry.     Findings: No lesion or rash.  Neurological:     Mental Status: She is alert and oriented to person, place, and time.  Psychiatric:        Mood and Affect: Mood normal.     Assessment and Plan:  Leslie Orr is a 24 y.o. female G0P0000 presenting to the [facility] Department for an yearly wellness and contraception visit  Contraception counseling:  Reviewed options based on patient desire and reproductive life plan. Patient is interested in Hormonal Injection. This was provided to the patient today.   Risks, benefits, and typical effectiveness rates were reviewed.  Questions were answered.  Written  information was also given to the patient to review.    The patient will follow up in  3 months for surveillance.  The patient was told to call with any further questions, or with any concerns about this method of contraception.  Emphasized use of condoms 100% of the time for STI prevention.  Emergency Contraception Precautions (ECP): Patient assessed for need of ECP. She is not a candidate based on depo provera  within recommended dates.    1. Family planning (Primary) Counseled pt on risk of bone density loss with long term use of DMPA and encouraged weight bearing exercise and vitamins with calcium included. Discussed that injections are every 11-13 weeks, unpatterned bleeding may persist until after the 2nd or 3rd shot then she may not have a period at all, most common side effect is weight gain.  - medroxyPROGESTERone  (DEPO-PROVERA ) injection 150 mg   2. Well woman exam (no gynecological exam) Last pap 05/04/22 Next pap due 04/2025 CBE due at age 63 No family hx of breast cancer Last mammo: never Mammo to start at 37.    Return in about 3 months (around 01/31/2025)  for DMPA (11-13 weeks).  No future appointments.  Leita MARLA Bolognese, NP "

## 2024-11-02 NOTE — Progress Notes (Signed)
 Pt is here PE and Depo. Depo IM injection given to pt at the LUOQ and pt tolerated well to the injection with no complications. Condoms declined, reminder and FP card given. Opportunity given to Patient to ask questions for any clarifications, questions answered. Wilkie Drought, RN.
